# Patient Record
Sex: Male | Born: 1990 | Race: Black or African American | Hispanic: No | Marital: Single | Smoking: Never smoker
Health system: Southern US, Community
[De-identification: ages and names within clinical notes are randomized; demographics above are authoritative.]

## PROBLEM LIST (undated history)

## (undated) DIAGNOSIS — J40 Bronchitis, not specified as acute or chronic: Secondary | ICD-10-CM

## (undated) DIAGNOSIS — J45909 Unspecified asthma, uncomplicated: Secondary | ICD-10-CM

---
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
## 2012-02-17 ENCOUNTER — Encounter (HOSPITAL_COMMUNITY): Payer: Self-pay | Admitting: *Deleted

## 2012-02-17 ENCOUNTER — Emergency Department (HOSPITAL_COMMUNITY)
Admission: EM | Admit: 2012-02-17 | Discharge: 2012-02-17 | Disposition: A | Discharge status: Home / Self Care | Payer: Medicaid Other | Attending: Emergency Medicine | Admitting: Emergency Medicine

## 2012-02-17 DIAGNOSIS — J40 Bronchitis, not specified as acute or chronic: Secondary | ICD-10-CM | POA: Insufficient documentation

## 2012-02-17 HISTORY — DX: Unspecified asthma, uncomplicated: J45.909

## 2012-02-17 MED ORDER — PREDNISONE 20 MG PO TABS: 60.0000 mg | ORAL_TABLET | Freq: Every day | ORAL | Status: AC

## 2012-02-17 MED ORDER — ALBUTEROL SULFATE (5 MG/ML) 0.5% IN NEBU
5.0000 mg | INHALATION_SOLUTION | Freq: Once | RESPIRATORY_TRACT | Status: AC
Start: 2012-02-17 — End: 2012-02-17
  Administered 2012-02-17: 5 mg via RESPIRATORY_TRACT
  Filled 2012-02-17: qty 0.5

## 2012-02-17 MED ORDER — ALBUTEROL SULFATE HFA 108 (90 BASE) MCG/ACT IN AERS
2.0000 | INHALATION_SPRAY | RESPIRATORY_TRACT | Status: DC | PRN
  Administered 2012-02-17: 2 via RESPIRATORY_TRACT
  Filled 2012-02-17: qty 6.7

## 2012-02-17 MED ORDER — PREDNISONE 20 MG PO TABS
60.0000 mg | ORAL_TABLET | Freq: Once | ORAL | Status: AC
  Administered 2012-02-17: 60 mg via ORAL
  Filled 2012-02-17: qty 3

## 2012-02-17 MED ORDER — IPRATROPIUM BROMIDE 0.02 % IN SOLN
0.5000 mg | Freq: Once | RESPIRATORY_TRACT | Status: AC
  Administered 2012-02-17: 0.5 mg via RESPIRATORY_TRACT
  Filled 2012-02-17: qty 5

## 2012-02-17 MED ORDER — ALBUTEROL SULFATE (5 MG/ML) 0.5% IN NEBU
5.0000 mg | INHALATION_SOLUTION | Freq: Once | RESPIRATORY_TRACT | Status: AC
  Administered 2012-02-17: 5 mg via RESPIRATORY_TRACT
  Filled 2012-02-17: qty 1

## 2012-02-17 NOTE — ED Notes (Signed)
hhn given 

## 2012-02-17 NOTE — ED Provider Notes (Signed)
History     CSN: [id number]  Arrival date & time 02/17/12  0116   First MD Initiated Contact with Patient 02/17/12 0209      Chief Complaint  Patient presents with  . Asthma   HPI  History provided by the patient. Patient is a 21 year old male with past history of asthma who presents with complaints of increasing wheezing and shortness of breath symptoms. Patient states symptoms began yesterday evening and all day today. Patient also felt worse tonight with difficulty breathing and hard time sleeping. Patient reports developing a productive cough. He denies any fever, chills or sweats. Denies any rhinorrhea or nasal congestion. Patient has no recent travel. Denies any known sick contacts. Patient reports having similar symptoms previously last year diagnosed with bronchitis. He does not have daily asthma symptoms are frequent asthma symptoms any longer. He had more asthma problems as a child. He has not used medications in several years.    Past Medical History  Diagnosis Date  . Asthma     History reviewed. No pertinent past surgical history.  No family history on file.  History  Substance Use Topics  . Smoking status: Never Smoker   . Smokeless tobacco: Not on file  . Alcohol Use: No      Review of Systems  Constitutional: Negative for fever and chills.  HENT: Negative for congestion and rhinorrhea.   Respiratory: Positive for cough, shortness of breath and wheezing.   Cardiovascular: Negative for chest pain.  Gastrointestinal: Negative for nausea, vomiting, abdominal pain and diarrhea.    Allergies  Review of patient's allergies indicates no known allergies.  Home Medications  No current outpatient prescriptions on file.  BP 139/86  Pulse 104  Temp 98.3 F (36.8 C) (Oral)  Resp 22  SpO2 97%  Physical Exam  Nursing note and vitals reviewed. Constitutional: He is oriented to person, place, and time. He appears well-developed and well-nourished. No distress.   HENT:  Head: Normocephalic.  Mouth/Throat: Oropharynx is clear and moist.  Neck: Normal range of motion. Neck supple.  Cardiovascular: Normal rate and regular rhythm.   Pulmonary/Chest: Effort normal. No respiratory distress. He has wheezes. He has no rales. He exhibits no tenderness.  Abdominal: Soft. There is no tenderness. There is no rebound.  Lymphadenopathy:    He has no cervical adenopathy.  Neurological: He is alert and oriented to person, place, and time.  Skin: Skin is warm.  Psychiatric: He has a normal mood and affect. His behavior is normal.    ED Course  Procedures     1. Bronchitis       MDM  2:15AM patient seen and evaluated. Patient currently in no acute distress. Patient still has some wheezing on exam. He has received breathing treatment and reports feeling improvement. He does feel an additional treatment will be beneficial.  Patient given second breathing treatment and has significant prominent wheezes. He has normal respirations and O2 sats at this time. Patient also given a dose of prednisone. We'll send patient home with albuterol MDI and small prescription for prednisone.        Angus Seller, [place] 02/18/12 [phone]

## 2012-02-17 NOTE — ED Notes (Signed)
The pt has had difficulty breathing all day worse tonight.  Audible wheezes

## 2012-02-18 NOTE — ED Provider Notes (Signed)
Medical screening examination/treatment/procedure(s) were performed by non-physician practitioner and as supervising physician I was immediately available for consultation/collaboration.    Vida Roller, MD 02/18/12 [phone]

## 2012-03-20 ENCOUNTER — Emergency Department (INDEPENDENT_AMBULATORY_CARE_PROVIDER_SITE_OTHER)
Admission: EM | Admit: 2012-03-20 | Discharge: 2012-03-20 | Disposition: A | Discharge status: Home / Self Care | Payer: Medicaid Other | Source: Home / Self Care | Attending: Emergency Medicine | Admitting: Emergency Medicine

## 2012-03-20 ENCOUNTER — Encounter (HOSPITAL_COMMUNITY): Payer: Self-pay | Admitting: Emergency Medicine

## 2012-03-20 DIAGNOSIS — J45909 Unspecified asthma, uncomplicated: Secondary | ICD-10-CM

## 2012-03-20 DIAGNOSIS — J45901 Unspecified asthma with (acute) exacerbation: Secondary | ICD-10-CM

## 2012-03-20 HISTORY — DX: Bronchitis, not specified as acute or chronic: J40

## 2012-03-20 MED ORDER — ALBUTEROL SULFATE (5 MG/ML) 0.5% IN NEBU
5.0000 mg | INHALATION_SOLUTION | Freq: Once | RESPIRATORY_TRACT | Status: AC
  Administered 2012-03-20: 5 mg via RESPIRATORY_TRACT

## 2012-03-20 MED ORDER — ALBUTEROL SULFATE (5 MG/ML) 0.5% IN NEBU
INHALATION_SOLUTION | RESPIRATORY_TRACT | Status: AC
  Filled 2012-03-20: qty 0.5

## 2012-03-20 MED ORDER — METHYLPREDNISOLONE SODIUM SUCC 125 MG IJ SOLR
INTRAMUSCULAR | Status: AC
  Filled 2012-03-20: qty 2

## 2012-03-20 MED ORDER — ALBUTEROL SULFATE HFA 108 (90 BASE) MCG/ACT IN AERS: 1.0000 | INHALATION_SPRAY | Freq: Four times a day (QID) | RESPIRATORY_TRACT | Status: DC | PRN

## 2012-03-20 MED ORDER — METHYLPREDNISOLONE SODIUM SUCC 125 MG IJ SOLR
125.0000 mg | Freq: Once | INTRAMUSCULAR | Status: AC
  Administered 2012-03-20: 125 mg via INTRAMUSCULAR

## 2012-03-20 MED ORDER — IPRATROPIUM BROMIDE 0.02 % IN SOLN
0.5000 mg | Freq: Once | RESPIRATORY_TRACT | Status: AC
  Administered 2012-03-20: 0.5 mg via RESPIRATORY_TRACT

## 2012-03-20 MED ORDER — PREDNISONE 10 MG PO TABS: ORAL_TABLET | ORAL | Status: DC

## 2012-03-20 MED ORDER — BECLOMETHASONE DIPROPIONATE 80 MCG/ACT IN AERS: 2.0000 | INHALATION_SPRAY | Freq: Two times a day (BID) | RESPIRATORY_TRACT | Status: DC

## 2012-03-20 NOTE — ED Provider Notes (Signed)
Chief Complaint  Patient presents with  . Bronchitis    History of Present Illness:   Colton Fowler is a 21 year old male who presents with a one-day history of dry cough wheezing, chest tightness, and shortness of breath. His chest feels a bit achy. He has a history of "bronchitis" in the past but has never been diagnosed as having asthma. He was seen at the hospital about a month ago with similar symptoms, however at that time he had a productive cough and was given some antibiotics as well as an inhaler. He denies any fever, chills, sweats, nasal congestion, rhinorrhea, sore throat, or GI complaints. He has no history of allergies or eczema.  Review of Systems:  Other than noted above, the patient denies any of the following symptoms. Systemic:  No fever, chills, sweats, fatigue, myalgias, headache, weight loss or anorexia. Eye:  No redness, itching, or drainage. ENT:  No earache, ear congestion, nasal congestion, sneezing, rhinorrhea, sinus pressure, sinus pain, post nasal drip, or sore throat. Lungs:  No cough, sputum production, or shortness of breath. No chest pain. GI:  No indigestion, heartburn, abdominal pain, nausea, or vomiting. Skin:  No rash or itching.  PMFSH:  Past medical history, family history, social history, meds, and allergies were reviewed.  No history of allergic rhinitis.  No use of tobacco.  Physical Exam:   Vital signs:  BP 127/84  Pulse 90  Temp 98.6 F (37 C) (Oral)  Resp 18  SpO2 98% General:  Alert, in no distress. Eye:  No conjunctival injection or drainage. Lids were normal. ENT:  TMs and canals were normal, without erythema or inflammation.  Nasal mucosa was clear and uncongested, without drainage.  Mucous membranes were moist.  Pharynx was clear, without exudate or drainage.  There were no oral ulcerations or lesions. Neck:  Supple, no adenopathy, tenderness or mass. Lungs:  No retractions or use of accessory muscles.  No respiratory distress.  He had  bilateral expiratory wheezes without rales or rhonchi, but with good air movement.   Heart:  Regular rhythm, without gallops, murmers or rubs. Skin:  Clear, warm, and dry, without rash or lesions.  Course in Urgent Care Center:   He was given a DuoNeb nebulizer treatment and Solu-Medrol 125 mg IM. He tolerated these well without any side effects. After the [facility] treatment he felt a lot better, denied any wheezing, and on auscultation his lungs were wheezing free.  Assessment:  The encounter diagnosis was Asthma attack.  Plan:   1.  The following meds were prescribed:   New Prescriptions   ALBUTEROL (PROVENTIL HFA;VENTOLIN HFA) 108 (90 BASE) MCG/ACT INHALER    Inhale 1-2 puffs into the lungs every 6 (six) hours as needed for wheezing.   BECLOMETHASONE (QVAR) 80 MCG/ACT INHALER    Inhale 2 puffs into the lungs 2 (two) times daily.   PREDNISONE (DELTASONE) 10 MG TABLET    Take 4 tabs daily for 4 days, 3 tabs daily for 4 days, 2 tabs daily for 4 days, then 1 tab daily for 4 days.   2.  The patient was instructed in symptomatic care and handouts were given. 3.  The patient was told to return if becoming worse in any way, if no better in 3 or 4 days, and given some red flag symptoms that would indicate earlier return.  Follow up:  The patient was told to follow up with primary care physician within a week.     Reuben Likes, MD 03/20/12  1331 

## 2012-03-20 NOTE — ED Notes (Signed)
Reports sob with walking and moving around.  Usual activities leave patient feeling tightness in chest.  Patient has audible wheezing.

## 2012-03-20 NOTE — ED Notes (Signed)
Patient reports bronchitis, shortness of breath noticed this morning.  Patient denies runny nose, cough prior to sob.

## 2012-08-26 ENCOUNTER — Emergency Department (HOSPITAL_COMMUNITY)
Admission: EM | Admit: 2012-08-26 | Discharge: 2012-08-27 | Disposition: A | Discharge status: Home / Self Care | Payer: Medicaid Other | Attending: Emergency Medicine | Admitting: Emergency Medicine

## 2012-08-26 ENCOUNTER — Encounter (HOSPITAL_COMMUNITY): Payer: Self-pay

## 2012-08-26 DIAGNOSIS — Z79899 Other long term (current) drug therapy: Secondary | ICD-10-CM | POA: Insufficient documentation

## 2012-08-26 DIAGNOSIS — R0789 Other chest pain: Secondary | ICD-10-CM | POA: Insufficient documentation

## 2012-08-26 DIAGNOSIS — J45909 Unspecified asthma, uncomplicated: Secondary | ICD-10-CM

## 2012-08-26 DIAGNOSIS — J45901 Unspecified asthma with (acute) exacerbation: Secondary | ICD-10-CM | POA: Insufficient documentation

## 2012-08-26 MED ORDER — ALBUTEROL SULFATE (5 MG/ML) 0.5% IN NEBU
5.0000 mg | INHALATION_SOLUTION | Freq: Once | RESPIRATORY_TRACT | Status: AC
  Administered 2012-08-26: 5 mg via RESPIRATORY_TRACT
  Filled 2012-08-26: qty 1

## 2012-08-26 NOTE — ED Notes (Signed)
Patient presents with chest tightness, wheezing and shortness of breath for the past 4 hours (around 7 pm). Denies any recent illness or sick contacts. No fevers, sweats or chills.

## 2012-08-26 NOTE — ED Notes (Signed)
MD at bedside. 

## 2012-08-27 ENCOUNTER — Emergency Department (HOSPITAL_COMMUNITY): Payer: Medicaid Other

## 2012-08-27 MED ORDER — ALBUTEROL SULFATE HFA 108 (90 BASE) MCG/ACT IN AERS: 1.0000 | INHALATION_SPRAY | Freq: Four times a day (QID) | RESPIRATORY_TRACT | Status: DC | PRN

## 2012-08-27 MED ORDER — PREDNISONE 10 MG PO TABS: 50.0000 mg | ORAL_TABLET | Freq: Every day | ORAL | Status: DC

## 2012-08-27 MED ORDER — BECLOMETHASONE DIPROPIONATE 80 MCG/ACT IN AERS: 1.0000 | INHALATION_SPRAY | RESPIRATORY_TRACT | Status: DC | PRN

## 2012-08-27 MED ORDER — PREDNISONE 20 MG PO TABS
60.0000 mg | ORAL_TABLET | Freq: Once | ORAL | Status: AC
  Administered 2012-08-27: 60 mg via ORAL
  Filled 2012-08-27: qty 3

## 2012-08-27 NOTE — ED Provider Notes (Signed)
History     CSN: [id number]  Arrival date & time 08/26/12  2334   First MD Initiated Contact with Patient 08/26/12 2353      Chief Complaint  Patient presents with  . Shortness of Breath    (Consider location/radiation/quality/duration/timing/severity/associated sxs/prior treatment) Patient is a 22 y.o. male presenting with shortness of breath. The history is provided by the patient.  Shortness of Breath Severity:  Moderate Onset quality:  Gradual Duration:  4 hours Timing:  Constant Progression:  Worsening Chronicity:  Recurrent Relieved by:  Nothing Worsened by:  Nothing tried Ineffective treatments:  None tried Associated symptoms: no chest pain   Risk factors: no hx of PE/DVT, no prolonged immobilization, no recent surgery and no tobacco use     Past Medical History  Diagnosis Date  . Asthma   . Bronchitis     History reviewed. No pertinent past surgical history.  No family history on file.  History  Substance Use Topics  . Smoking status: Never Smoker   . Smokeless tobacco: Never Used  . Alcohol Use: No      Review of Systems  Respiratory: Positive for shortness of breath.   Cardiovascular: Negative for chest pain.  All other systems reviewed and are negative.    Allergies  Review of patient's allergies indicates no known allergies.  Home Medications   Current Outpatient Rx  Name  Route  Sig  Dispense  Refill  . albuterol (PROVENTIL HFA;VENTOLIN HFA) 108 (90 BASE) MCG/ACT inhaler   Inhalation   Inhale 1-2 puffs into the lungs every 6 (six) hours as needed for wheezing.   1 Inhaler   0   . beclomethasone (QVAR) 80 MCG/ACT inhaler   Inhalation   Inhale 2 puffs into the lungs 2 (two) times daily.   1 Inhaler   0   . predniSONE (DELTASONE) 10 MG tablet      Take 4 tabs daily for 4 days, 3 tabs daily for 4 days, 2 tabs daily for 4 days, then 1 tab daily for 4 days.   40 tablet   0     BP 144/82  Pulse 73  Temp(Src) 97.6 F (36.4 C)  (Oral)  Resp 18  SpO2 99%  Physical Exam  Constitutional: He is oriented to person, place, and time. He appears well-developed and well-nourished.  HENT:  Head: Normocephalic and atraumatic.  Eyes: Conjunctivae are normal. Pupils are equal, round, and reactive to light.  Neck: Normal range of motion. Neck supple.  Cardiovascular: Normal rate, regular rhythm, normal heart sounds and intact distal pulses.   Pulmonary/Chest: Effort normal. He has wheezes.  Abdominal: Soft. Bowel sounds are normal.  Neurological: He is alert and oriented to person, place, and time.  Skin: Skin is warm and dry.  Psychiatric: He has a normal mood and affect. His behavior is normal. Judgment and thought content normal.    ED Course  Procedures (including critical care time)  Labs Reviewed - No data to display Dg Chest [facility] 1 View  08/27/2012  *RADIOLOGY REPORT*  Clinical Data: Shortness of breath.  PORTABLE CHEST - 1 VIEW  Comparison: 08/02/2005.  Findings: The cardiac silhouette, mediastinal and hilar contours are normal.  The lungs are clear.  No pleural effusion.  The bony thorax is intact.  IMPRESSION: Normal chest x-ray.   Original Report Authenticated By: Rudie Meyer, M.D.      No diagnosis found.    MDM  Mild wheezing,  Out of rx asthma medication.  Will  ibr, steriod,  reassess        Rosanne Ashing, MD 08/27/12 0020

## 2012-08-27 NOTE — ED Notes (Signed)
Patient resting on stretcher at this time. States he was having shortness of breath, tightness and wheezing in chest. After neb tx states he is feeling much better and denies any complaints at this time. No acute distress noted, resp are even and unlabored at this time. Call light on bed. Will continue to monitor.

## 2012-08-27 NOTE — ED Notes (Signed)
Patient given discharge instructions for asthma. rx for albuterol, qvar, and prendisone. Advised to follow up with primary care or to return to this department if condition worsens. Patient voiced understanding of all instructions and had no further questions. Patient ambulated to front lobby without difficulty.

## 2014-06-14 ENCOUNTER — Emergency Department (INDEPENDENT_AMBULATORY_CARE_PROVIDER_SITE_OTHER)
Admission: EM | Admit: 2014-06-14 | Discharge: 2014-06-14 | Disposition: A | Discharge status: Home / Self Care | Payer: Self-pay | Source: Home / Self Care | Attending: Family Medicine | Admitting: Family Medicine

## 2014-06-14 ENCOUNTER — Encounter (HOSPITAL_COMMUNITY): Payer: Self-pay | Admitting: Emergency Medicine

## 2014-06-14 DIAGNOSIS — L42 Pityriasis rosea: Secondary | ICD-10-CM

## 2014-06-14 NOTE — ED Provider Notes (Signed)
Colton Fowler is a 24 y.o. male who presents to Urgent Care today for rash. Patient has developed a rash on his torso over the past 5 days. Started out as one largest patch on his right abdomen. It does not hurt or itch or bother him. No fevers or chills. He feels well otherwise. No new medications of detergents shampoos etc.   Past Medical History  Diagnosis Date  . Asthma   . Bronchitis    History reviewed. No pertinent past surgical history. History  Substance Use Topics  . Smoking status: Never Smoker   . Smokeless tobacco: Never Used  . Alcohol Use: Yes     Comment: once a month   ROS as above Medications: No current facility-administered medications for this encounter.   Current Outpatient Prescriptions  Medication Sig Dispense Refill  . albuterol (PROVENTIL HFA;VENTOLIN HFA) 108 (90 BASE) MCG/ACT inhaler Inhale 1-2 puffs into the lungs every 6 (six) hours as needed for wheezing. 1 Inhaler 0  . albuterol (PROVENTIL HFA;VENTOLIN HFA) 108 (90 BASE) MCG/ACT inhaler Inhale 1-2 puffs into the lungs every 6 (six) hours as needed for wheezing. 1 Inhaler 0   No Known Allergies   Exam:  BP 121/73 mmHg  Pulse 57  Temp(Src) 98.7 F (37.1 C) (Oral)  Resp 16  SpO2 97% Gen: Well NAD Skin: Mildly erythematous blanching scaly macular rash abdomen. Rash extends to extremities. Harold patch right abdomen  No results found for this or any previous visit (from the past 24 hour(s)). No results found.  Assessment and Plan: 24 y.o. male with pityriasis rosea. Watchful waiting.  Discussed warning signs or symptoms. Please see discharge instructions. Patient expresses understanding.     [name], MD 06/14/14 [phone]

## 2014-06-14 NOTE — ED Notes (Signed)
Pt noted red bumps on his chest last Wednesday.  Since then it has spread to his back and arms.  Pt denies any itching.

## 2014-06-14 NOTE — Discharge Instructions (Signed)
Thank you for coming in today. ° ° °Pityriasis Rosea °Pityriasis rosea is a rash which is probably caused by a virus. It generally starts as a scaly, red patch on the trunk (the area of the body that a t-shirt would cover) but does not appear on sun exposed areas. The rash is usually preceded by an initial larger spot called the "herald patch" a week or more before the rest of the rash appears. Generally within one to two days the rash appears rapidly on the trunk, upper arms, and sometimes the upper legs. The rash usually appears as flat, oval patches of scaly pink color. The rash can also be raised and one is able to feel it with a finger. The rash can also be finely crinkled and may slough off leaving a ring of scale around the spot. Sometimes a mild sore throat is present with the rash. It usually affects children and young adults in the spring and autumn. Women are more frequently affected than men. °TREATMENT  °Pityriasis rosea is a self-limited condition. This means it goes away within 4 to 8 weeks without treatment. The spots may persist for several months, especially in darker-colored skin after the rash has resolved and healed. Benadryl and steroid creams may be used if itching is a problem. °SEEK MEDICAL CARE IF:  °· Your rash does not go away or persists longer than three months. °· You develop fever and joint pain. °· You develop severe headache and confusion. °· You develop breathing difficulty, vomiting and/or extreme weakness. °Document Released: 06/27/2001 Document Revised: 08/13/2011 Document Reviewed: 07/16/2008 °ExitCare® Patient Information ©2015 [employer]. This information is not intended to replace advice given to you by your health care provider. Make sure you discuss any questions you have with your health care provider. ° °

## 2014-09-12 ENCOUNTER — Emergency Department (INDEPENDENT_AMBULATORY_CARE_PROVIDER_SITE_OTHER)
Admission: EM | Admit: 2014-09-12 | Discharge: 2014-09-12 | Disposition: A | Discharge status: Home / Self Care | Payer: Self-pay | Source: Home / Self Care | Attending: Family Medicine | Admitting: Family Medicine

## 2014-09-12 ENCOUNTER — Encounter (HOSPITAL_COMMUNITY): Payer: Self-pay | Admitting: Emergency Medicine

## 2014-09-12 DIAGNOSIS — J9801 Acute bronchospasm: Secondary | ICD-10-CM

## 2014-09-12 DIAGNOSIS — J40 Bronchitis, not specified as acute or chronic: Secondary | ICD-10-CM

## 2014-09-12 MED ORDER — ALBUTEROL SULFATE HFA 108 (90 BASE) MCG/ACT IN AERS
1.0000 | INHALATION_SPRAY | Freq: Four times a day (QID) | RESPIRATORY_TRACT | Status: DC | PRN
Start: 2014-09-12 — End: 2017-09-05

## 2014-09-12 MED ORDER — ALBUTEROL SULFATE (2.5 MG/3ML) 0.083% IN NEBU
INHALATION_SOLUTION | RESPIRATORY_TRACT | Status: AC
  Filled 2014-09-12: qty 6

## 2014-09-12 MED ORDER — IPRATROPIUM BROMIDE 0.02 % IN SOLN
RESPIRATORY_TRACT | Status: AC
  Filled 2014-09-12: qty 2.5

## 2014-09-12 MED ORDER — PREDNISONE 20 MG PO TABS
60.0000 mg | ORAL_TABLET | Freq: Once | ORAL | Status: AC
  Administered 2014-09-12: 60 mg via ORAL

## 2014-09-12 MED ORDER — PREDNISONE 20 MG PO TABS
ORAL_TABLET | ORAL | Status: AC
  Filled 2014-09-12: qty 3

## 2014-09-12 MED ORDER — ALBUTEROL SULFATE (2.5 MG/3ML) 0.083% IN NEBU
5.0000 mg | INHALATION_SOLUTION | Freq: Once | RESPIRATORY_TRACT | Status: AC
  Administered 2014-09-12: 5 mg via RESPIRATORY_TRACT

## 2014-09-12 MED ORDER — IPRATROPIUM BROMIDE 0.02 % IN SOLN
0.5000 mg | Freq: Once | RESPIRATORY_TRACT | Status: AC
  Administered 2014-09-12: 0.5 mg via RESPIRATORY_TRACT

## 2014-09-12 MED ORDER — LORATADINE 10 MG PO TABS: 10.0000 mg | ORAL_TABLET | Freq: Every day | ORAL | Status: DC

## 2014-09-12 NOTE — ED Provider Notes (Signed)
CSN: [id number]     Arrival date & time 09/12/14  1307 History   First MD Initiated Contact with Patient 09/12/14 1403     Chief Complaint  Patient presents with  . 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 male presenting with URI. The history is provided by the patient.  URI Presenting symptoms: congestion, cough and rhinorrhea   Presenting symptoms: no fever and no sore throat   Severity:  Moderate Onset quality:  Gradual Duration:  2 days Timing:  Constant Progression:  Worsening Chronicity:  New Associated symptoms: wheezing     Past Medical History  Diagnosis Date  . Asthma   . Bronchitis    History reviewed. No pertinent past surgical history. No family history on file. History  Substance Use Topics  . Smoking status: Never Smoker   . Smokeless tobacco: Never Used  . Alcohol Use: Yes     Comment: once a month    Review of Systems  Constitutional: Negative for fever and chills.  HENT: Positive for congestion and rhinorrhea. Negative for postnasal drip, sore throat and trouble swallowing.   Eyes: Negative.   Respiratory: Positive for cough, chest tightness, shortness of breath and wheezing.   Cardiovascular: Negative.   Gastrointestinal: Negative.   Musculoskeletal: Negative for back pain.  Skin: Negative.     Allergies  Review of patient's allergies indicates no known allergies.  Home Medications   Prior to Admission medications   Medication Sig Start Date End Date Taking? Authorizing Provider  albuterol (PROVENTIL HFA;VENTOLIN HFA) 108 (90 BASE) MCG/ACT inhaler Inhale 1-2 puffs into the lungs every 6 (six) hours as needed for wheezing or shortness of breath. 09/12/14   Mathis FareJennifer Lee H Emberleigh Reily, PA  loratadine (CLARITIN) 10 MG tablet Take 1 tablet (10 mg  total) by mouth daily. 09/12/14   Jess BartersJennifer Lee H Enjoli Tidd, PA   BP 127/85 mmHg  Pulse 64  Temp(Src) 98.1 F (36.7 C) (Oral)  Resp 16  SpO2 98% Physical Exam  Constitutional: He is oriented to person, place, and time. He appears well-developed and well-nourished.  HENT:  Head: Normocephalic and atraumatic.  Right Ear: Hearing, tympanic membrane, external ear and ear canal normal.  Left Ear: Hearing, tympanic membrane, external ear and ear canal normal.  Nose: Nose normal.  Mouth/Throat: Uvula is midline, oropharynx is clear and moist and mucous membranes are normal.  Eyes: Conjunctivae are normal. No scleral icterus.  Neck: Normal range of motion. Neck supple.  Cardiovascular: Normal rate, regular rhythm and normal heart sounds.   Pulmonary/Chest: Effort normal. No stridor. No respiratory distress. He has wheezes. He has no rales. He exhibits no tenderness.  Musculoskeletal: Normal range of motion.  Lymphadenopathy:    He has no cervical adenopathy.  Neurological: He is alert and oriented to person, place, and time.  Skin: Skin is warm and dry.  Psychiatric: He has a normal mood and affect. His behavior is normal.  Nursing note and vitals reviewed.   ED Course  Procedures (including critical care time) Labs Review Labs Reviewed - No data to display  Imaging Review No results found.   MDM   1. Bronchospasm   2. Bronchitis    Given 5mg  albuterol and 0.5mg  atrovent neb [facility]  No respiratory distress, fever or hypoxia. Suspect bronchospasm to have been triggered by either  viral URI or environmental allergy.  Will continue treatment at home with Albuterol MDI & Claritin and advise PCP follow up if no improvement. Instructed to report to his nearest ER if symptoms become suddenly worse or severe despite above medications    Ria Clock, [place] 09/12/14 [phone]

## 2014-09-12 NOTE — ED Notes (Signed)
Patient c/o chest congestion with shortness of breath x 3 days. Patient reports he has a hx of asthma and bronchitis. States he took Mucinex with mild relief. Patient is in NAD.

## 2014-09-12 NOTE — Discharge Instructions (Signed)
Bronchospasm °A bronchospasm is a spasm or tightening of the airways going into the lungs. During a bronchospasm breathing becomes more difficult because the airways get smaller. When this happens there can be coughing, a whistling sound when breathing (wheezing), and difficulty breathing. Bronchospasm is often associated with asthma, but not all patients who experience a bronchospasm have asthma. °CAUSES  °A bronchospasm is caused by inflammation or irritation of the airways. The inflammation or irritation may be triggered by:  °· Allergies (such as to animals, pollen, food, or mold). Allergens that cause bronchospasm may cause wheezing immediately after exposure or many hours later.   °· Infection. Viral infections are believed to be the most common cause of bronchospasm.   °· Exercise.   °· Irritants (such as pollution, cigarette smoke, strong odors, aerosol sprays, and paint fumes).   °· Weather changes. Winds increase molds and pollens in the air. Rain refreshes the air by washing irritants out. Cold air may cause inflammation.   °· Stress and emotional upset.   °SIGNS AND SYMPTOMS  °· Wheezing.   °· Excessive nighttime coughing.   °· Frequent or severe coughing with a simple cold.   °· Chest tightness.   °· Shortness of breath.   °DIAGNOSIS  °Bronchospasm is usually diagnosed through a history and physical exam. Tests, such as chest X-rays, are sometimes done to look for other conditions. °TREATMENT  °· Inhaled medicines can be given to open up your airways and help you breathe. The medicines can be given using either an inhaler or a nebulizer machine. °· Corticosteroid medicines may be given for severe bronchospasm, usually when it is associated with asthma. °HOME CARE INSTRUCTIONS  °· Always have a plan prepared for seeking medical care. Know when to call your health care provider and local emergency services (911 in the [place] Know where you can access local emergency care. °· Only take medicines as  directed by your health care provider. °· If you were prescribed an inhaler or nebulizer machine, ask your health care provider to explain how to use it correctly. Always use a spacer with your inhaler if you were given one. °· It is necessary to remain calm during an attack. Try to relax and breathe more slowly.  °· Control your home environment in the following ways:   °¨ Change your heating and air conditioning filter at least once a month.   °¨ Limit your use of fireplaces and wood stoves. °¨ Do not smoke and do not allow smoking in your home.   °¨ Avoid exposure to perfumes and fragrances.   °¨ Get rid of pests (such as roaches and mice) and their droppings.   °¨ Throw away plants if you see mold on them.   °¨ Keep your house clean and dust free.   °¨ Replace carpet with wood, tile, or vinyl flooring. Carpet can trap dander and dust.   °¨ Use allergy-proof pillows, mattress covers, and box spring covers.   °¨ Wash bed sheets and blankets every week in hot water and dry them in a dryer.   °¨ Use blankets that are made of polyester or cotton.   °¨ Wash hands frequently. °SEEK MEDICAL CARE IF:  °· You have muscle aches.   °· You have chest pain.   °· The sputum changes from clear or white to yellow, green, gray, or bloody.   °· The sputum you cough up gets thicker.   °· There are problems that may be related to the medicine you are given, such as a rash, itching, swelling, or trouble breathing.   °SEEK IMMEDIATE MEDICAL CARE IF:  °· You have worsening wheezing and coughing even   after taking your prescribed medicines.   °· You have increased difficulty breathing.   °· You develop severe chest pain. °MAKE SURE YOU:  °· Understand these instructions. °· Will watch your condition. °· Will get help right away if you are not doing well or get worse. °Document Released: 05/24/2003 Document Revised: 05/26/2013 Document Reviewed: 11/10/2012 °ExitCare® Patient Information ©2015 [employer]. This information is not  intended to replace advice given to you by your health care provider. Make sure you discuss any questions you have with your health care provider. ° °

## 2016-12-27 ENCOUNTER — Ambulatory Visit: Payer: Self-pay | Admitting: Registered Nurse

## 2016-12-27 VITALS — BP 140/84 | HR 52 | Temp 97.7°F

## 2016-12-27 DIAGNOSIS — K5289 Other specified noninfective gastroenteritis and colitis: Secondary | ICD-10-CM

## 2016-12-27 DIAGNOSIS — J301 Allergic rhinitis due to pollen: Secondary | ICD-10-CM

## 2016-12-27 DIAGNOSIS — H6593 Unspecified nonsuppurative otitis media, bilateral: Secondary | ICD-10-CM

## 2016-12-27 DIAGNOSIS — R1031 Right lower quadrant pain: Secondary | ICD-10-CM

## 2016-12-27 DIAGNOSIS — G8929 Other chronic pain: Secondary | ICD-10-CM

## 2016-12-27 MED ORDER — BISMUTH SUBSALICYLATE 262 MG PO CHEW
524.0000 mg | CHEWABLE_TABLET | ORAL | 0 refills | Status: AC | PRN
Start: 2016-12-27 — End: 2016-12-30

## 2016-12-27 MED ORDER — ACETAMINOPHEN 500 MG PO TABS: 500.0000 mg | ORAL_TABLET | Freq: Four times a day (QID) | ORAL | 0 refills | Status: DC | PRN

## 2016-12-27 NOTE — Progress Notes (Signed)
Subjective:    Patient ID: Colton Fowler, male    DOB: [birth year], 26 y.o.   MRN: [id number]  26y/o african [place] male new patient here for evaluation RLQ pain after taking ibuprofen this morning x30 mins. Sudden onset. Pain 7/10. +nausea, denies v/d. Afebrile currently. Took 800mg  Ibuprofen at 10am today for L mid back pain which has resolved. Appendix present, denies surgical Hx.   PMHx asthma, seasonal allergies; ran out of claritin not taking any allergy medication right now and not having any problems.  Ate breakfast and snack at break without difficulty.  Has taken motrin in the past without problems for headache or muscle aches.  Usually once a month.  Had usual formed bowel movement this morning.  Denied heartburn, diarrhea, blood in stool red or black.      Review of Systems  Constitutional: Negative for activity change, appetite change, chills, diaphoresis, fatigue and fever.  HENT: Negative for congestion, ear pain, facial swelling, hearing loss, mouth sores, nosebleeds, postnasal drip, rhinorrhea, sinus pain, sinus pressure, sneezing, sore throat, tinnitus, trouble swallowing and voice change.   Eyes: Negative for photophobia, pain, discharge and visual disturbance.  Respiratory: Negative for cough, chest tightness, shortness of breath, wheezing and stridor.   Cardiovascular: Negative for chest pain, palpitations and leg swelling.  Gastrointestinal: Positive for abdominal pain. Negative for abdominal distention, anal bleeding, blood in stool, constipation, diarrhea, nausea, rectal pain and vomiting.  Endocrine: Negative for cold intolerance and heat intolerance.  Genitourinary: Negative for difficulty urinating, dysuria and hematuria.  Musculoskeletal: Positive for back pain. Negative for arthralgias, gait problem, joint swelling, myalgias, neck pain and neck stiffness.  Skin: Negative for color change, pallor, rash and wound.  Allergic/Immunologic: Negative for environmental  allergies and food allergies.  Neurological: Negative for dizziness, tremors, seizures, syncope, facial asymmetry, speech difficulty, weakness, light-headedness, numbness and headaches.  Hematological: Negative for adenopathy. Does not bruise/bleed easily.  Psychiatric/Behavioral: Negative for agitation, confusion and sleep disturbance. The patient is not nervous/anxious.        Objective:   Physical Exam  Constitutional: He is oriented to person, place, and time. Vital signs are normal. He appears well-developed and well-nourished. He is active and cooperative.  Non-toxic appearance. He does not have a sickly appearance. He does not appear ill. No distress.  HENT:  Head: Normocephalic and atraumatic.  Right Ear: Hearing, external ear and ear canal normal. A middle ear effusion is present.  Left Ear: Hearing, external ear and ear canal normal. A middle ear effusion is present.  Nose: Mucosal edema and rhinorrhea present. No nose lacerations, sinus tenderness, nasal deformity, septal deviation or nasal septal hematoma. No epistaxis.  No foreign bodies. Right sinus exhibits no maxillary sinus tenderness and no frontal sinus tenderness. Left sinus exhibits no maxillary sinus tenderness and no frontal sinus tenderness.  Mouth/Throat: Uvula is midline and mucous membranes are normal. Mucous membranes are not pale, not dry and not cyanotic. He does not have dentures. No oral lesions. No trismus in the jaw. Normal dentition. No dental abscesses, uvula swelling, lacerations or dental caries. Posterior oropharyngeal edema and posterior oropharyngeal erythema present. No oropharyngeal exudate or tonsillar abscesses.  Cobblestoning posterior pharynx; bilateral TMs air fluid level clear; bilateral allergic shiners; bilateral nasal turbinates edema/erythema clear discharge  Eyes: Pupils are equal, round, and reactive to light. Conjunctivae, EOM and lids are normal. Right eye exhibits no chemosis, no discharge,  no exudate and no hordeolum. No foreign body present in the right eye. Left eye exhibits  no chemosis, no discharge, no exudate and no hordeolum. No foreign body present in the left eye. Right conjunctiva is not injected. Right conjunctiva has no hemorrhage. Left conjunctiva is not injected. Left conjunctiva has no hemorrhage. No scleral icterus. Right eye exhibits normal extraocular motion and no nystagmus. Left eye exhibits normal extraocular motion and no nystagmus. Right pupil is round and reactive. Left pupil is round and reactive. Pupils are equal.  Neck: Trachea normal, normal range of motion and phonation normal. Neck supple. No tracheal tenderness, no spinous process tenderness and no muscular tenderness present. No neck rigidity. No tracheal deviation, no edema, no erythema and normal range of motion present. No thyroid mass and no thyromegaly present.  Cardiovascular: Normal rate, regular rhythm, S1 normal, S2 normal, normal heart sounds and intact distal pulses.  PMI is not displaced.  Exam reveals no gallop and no friction rub.   No murmur heard. Pulmonary/Chest: Effort normal and breath sounds normal. No stridor. No respiratory distress. He has no decreased breath sounds. He has no wheezes. He has no rhonchi. He has no rales.  Abdominal: Soft. Normal appearance and bowel sounds are normal. He exhibits no shifting dullness, no distension, no pulsatile liver, no fluid wave, no abdominal bruit, no ascites, no pulsatile midline mass and no mass. There is hepatosplenomegaly. There is tenderness in the right lower quadrant. There is no rigidity, no rebound, no guarding, no tenderness at McBurney's point and negative Murphy's sign. Hernia confirmed negative in the ventral area.    Dull to percussion x 4 quads; normoactive bowel sounds; negative heel drop; on/off exam table; in/out of chair without difficulty worsening pain with back extension; no change with flexion/lateral flexion/rotation lumbar   Musculoskeletal: Normal range of motion. He exhibits no edema or tenderness.       Right shoulder: Normal.       Left shoulder: Normal.       Right elbow: Normal.      Left elbow: Normal.       Right hip: Normal.       Left hip: Normal.       Right knee: Normal.       Left knee: Normal.       Cervical back: Normal.       Thoracic back: Normal.       Lumbar back: Normal.       Right hand: Normal.       Left hand: Normal.  Lymphadenopathy:       Head (right side): No submental, no submandibular, no tonsillar, no preauricular, no posterior auricular and no occipital adenopathy present.       Head (left side): No submental, no submandibular, no tonsillar, no preauricular, no posterior auricular and no occipital adenopathy present.    He has no cervical adenopathy.       Right cervical: No superficial cervical, no deep cervical and no posterior cervical adenopathy present.      Left cervical: No superficial cervical, no deep cervical and no posterior cervical adenopathy present.  Neurological: He is alert and oriented to person, place, and time. He has normal strength. He displays no atrophy and no tremor. No cranial nerve deficit or sensory deficit. He exhibits normal muscle tone. He displays no seizure activity. Coordination and gait normal. GCS eye subscore is 4. GCS verbal subscore is 5. GCS motor subscore is 6.  On/off exam table; in/out of chair without difficulty; gait sure and steady in hall strength 5/5 bilaterally  Skin: Skin is  warm, dry and intact. No abrasion, no bruising, no burn, no ecchymosis, no laceration, no lesion, no petechiae and no rash noted. He is not diaphoretic. No cyanosis or erythema. No pallor. Nails show no clubbing.  Psychiatric: He has a normal mood and affect. His speech is normal and behavior is normal. Judgment and thought content normal. Cognition and memory are normal.  Nursing note and vitals reviewed.         Assessment & Plan:   A-gastroenteritis  P-peptobismol 1-2 tabs po prn diarrhea given 2 UD from clinic stock; stop ibuprofen may take tylenol 1000mg  po QID prn pain.  Bland diet ER precautions given  I have recommended clear fluids and bland diet.  Avoid dairy/spicy, fried and large portions of meat while having nausea.  If vomiting hold po intake x 1 hour.  Then sips clear fluids like broths, ginger ale, power ade, gatorade, pedialyte may advance to soft/bland if no vomiting x 24 hours and appetite returned otherwise hydration main focus.     Return to the clinic if symptoms persist or worsen; I have alerted the patient to call if high fever, dehydration, marked weakness, fainting, increased abdominal pain, repetitive vomiting, blood in stool or vomit (red or black) or go to ER if clinic closed for re-evaluation   Exitcare handout on gastroenteritis and abdomen pain given to patient. Patient verbalized agreement and understanding of treatment plan and had no further questions at this time.

## 2016-12-27 NOTE — Patient Instructions (Signed)
Allergic Rhinitis Allergic rhinitis is when the mucous membranes in the nose respond to allergens. Allergens are particles in the air that cause your body to have an allergic reaction. This causes you to release allergic antibodies. Through a chain of events, these eventually cause you to release histamine into the blood stream. Although meant to protect the body, it is this release of histamine that causes your discomfort, such as frequent sneezing, congestion, and an itchy, runny nose. What are the causes? Seasonal allergic rhinitis (hay fever) is caused by pollen allergens that may come from grasses, trees, and weeds. Year-round allergic rhinitis (perennial allergic rhinitis) is caused by allergens such as house dust mites, pet dander, and mold spores. What are the signs or symptoms?  Nasal stuffiness (congestion).  Itchy, runny nose with sneezing and tearing of the eyes. How is this diagnosed? Your health care provider can help you determine the allergen or allergens that trigger your symptoms. If you and your health care provider are unable to determine the allergen, skin or blood testing may be used. Your health care provider will diagnose your condition after taking your health history and performing a physical exam. Your health care provider may assess you for other related conditions, such as asthma, pink eye, or an ear infection. How is this treated? Allergic rhinitis does not have a cure, but it can be controlled by:  Medicines that block allergy symptoms. These may include allergy shots, nasal sprays, and oral antihistamines.  Avoiding the allergen.  Hay fever may often be treated with antihistamines in pill or nasal spray forms. Antihistamines block the effects of histamine. There are over-the-counter medicines that may help with nasal congestion and swelling around the eyes. Check with your health care provider before taking or giving this medicine. If avoiding the allergen or the  medicine prescribed do not work, there are many new medicines your health care provider can prescribe. Stronger medicine may be used if initial measures are ineffective. Desensitizing injections can be used if medicine and avoidance does not work. Desensitization is when a patient is given ongoing shots until the body becomes less sensitive to the allergen. Make sure you follow up with your health care provider if problems continue. Follow these instructions at home: It is not possible to completely avoid allergens, but you can reduce your symptoms by taking steps to limit your exposure to them. It helps to know exactly what you are allergic to so that you can avoid your specific triggers. Contact a health care provider if:  You have a fever.  You develop a cough that does not stop easily (persistent).  You have shortness of breath.  You start wheezing.  Symptoms interfere with normal daily activities. This information is not intended to replace advice given to you by your health care provider. Make sure you discuss any questions you have with your health care provider. Document Released: 02/13/2001 Document Revised: 01/20/2016 Document Reviewed: 01/26/2013 Elsevier Interactive Patient Education  2017 [employer] Abdominal Pain, Adult Abdominal pain can be caused by many things. Often, abdominal pain is not serious and it gets better with no treatment or by being treated at home. However, sometimes abdominal pain is serious. Your health care provider will do a medical history and a physical exam to try to determine the cause of your abdominal pain. Follow these instructions at home:  Take over-the-counter and prescription medicines only as told by your health care provider. Do not take a laxative unless told by your health  care provider.  Drink enough fluid to keep your urine clear or pale yellow.  Watch your condition for any changes.  Keep all follow-up visits as told by your health  care provider. This is important. Contact a health care provider if:  Your abdominal pain changes or gets worse.  You are not hungry or you lose weight without trying.  You are constipated or have diarrhea for more than 2-3 days.  You have pain when you urinate or have a bowel movement.  Your abdominal pain wakes you up at night.  Your pain gets worse with meals, after eating, or with certain foods.  You are throwing up and cannot keep anything down.  You have a fever. Get help right away if:  Your pain does not go away as soon as your health care provider told you to expect.  You cannot stop throwing up.  Your pain is only in areas of the abdomen, such as the right side or the left lower portion of the abdomen.  You have bloody or black stools, or stools that look like tar.  You have severe pain, cramping, or bloating in your abdomen.  You have signs of dehydration, such as: ? Dark urine, very little urine, or no urine. ? Cracked lips. ? Dry mouth. ? Sunken eyes. ? Sleepiness. ? Weakness. This information is not intended to replace advice given to you by your health care provider. Make sure you discuss any questions you have with your health care provider. Document Released: 02/28/2005 Document Revised: 12/09/2015 Document Reviewed: 11/02/2015 Elsevier Interactive Patient Education  2017 [employer]. Norovirus Infection A norovirus infection is caused by exposure to a virus in a group of similar viruses (noroviruses). This type of infection causes inflammation in your stomach and intestines (gastroenteritis). Norovirus is the most common cause of gastroenteritis. It also causes food poisoning. Anyone can get a norovirus infection. It spreads very easily (contagious). You can get it from contaminated food, water, surfaces, or other people. Norovirus is found in the stool or vomit of infected people. You can spread the infection as soon as you feel sick until 2 weeks after  you recover. Symptoms usually begin within 2 days after you become infected. Most norovirus symptoms affect the digestive system. What are the causes? Norovirus infection is caused by contact with norovirus. You can catch norovirus if you:  Eat or drink something contaminated with norovirus.  Touch surfaces or objects contaminated with norovirus and then put your hand in your mouth.  Have direct contact with an infected person who has symptoms.  Share food, drink, or utensils with someone with who is sick with norovirus.  What are the signs or symptoms? Symptoms of norovirus may include:  Nausea.  Vomiting.  Diarrhea.  Stomach cramps.  Fever.  Chills.  Headache.  Muscle aches.  Tiredness.  How is this diagnosed? Your health care provider may suspect norovirus based on your symptoms and physical exam. Your health care provider may also test a sample of your stool or vomit for the virus. How is this treated? There is no specific treatment for norovirus. Most people get better without treatment in about 2 days. Follow these instructions at home:  Replace lost fluids by drinking plenty of water or rehydration fluids containing important minerals called electrolytes. This prevents dehydration. Drink enough fluid to keep your urine clear or pale yellow.  Do not prepare food for others while you are infected. Wait at least 3 days after recovering from the  illness to do that. How is this prevented?  Wash your hands often, especially after using the toilet or changing a diaper.  Wash fruits and vegetables thoroughly before preparing or serving them.  Throw out any food that a sick person may have touched.  Disinfect contaminated surfaces immediately after someone in the household has been sick. Use a bleach-based household cleaner.  Immediately remove and wash soiled clothes or sheets. Contact a health care provider if:  Your vomiting, diarrhea, and stomach pain is  getting worse.  Your symptoms of norovirus do not go away after 2-3 days. Get help right away if: You develop symptoms of dehydration that do not improve with fluid replacement. This may include:  Excessive sleepiness.  Lack of tears.  Dry mouth.  Dizziness when standing.  Weak pulse.  This information is not intended to replace advice given to you by your health care provider. Make sure you discuss any questions you have with your health care provider. Document Released: 08/11/2002 Document Revised: 10/27/2015 Document Reviewed: 10/29/2013 Elsevier Interactive Patient Education  2017 [employer]

## 2017-03-15 ENCOUNTER — Ambulatory Visit: Payer: Self-pay | Admitting: *Deleted

## 2017-03-15 VITALS — HR 78 | Resp 18

## 2017-03-18 NOTE — Progress Notes (Signed)
03/15/17: [facility] [employer] was called at 3pm. RN responded. Found pt sitting upright in chair. No obvious distress. Coworkers report he said he felt short of breath and had to work hard to breathe. O2 sat 98%, pulse wnl, RR 18, unlabored. Speaking in complete sentences. No obvious audible wheezing. Mildly diminished breath sounds LLL. Mild wheezing RLL. Remainder of breath sounds CTA.   Pt had reported to RN office earlier in the day asking if we kept inhalers on hand as he had forgotten his at home today. He reported he was asymptomatic at that time, but had a Hx of asthma and bronchitis and was worried it would get worse during the day working in the heat [place]. As we do not carry inhalers, encouraged pt to go home on lunch break to get his-he lives in [place]- or have Rx from his pharmacy transferred to the external pharmacy 5 min from work to have one refill completed there that he can pick up at work-he reports he does not drive and cannot get there. Advised pt if he starts to feel short of breath or lightheaded to sit down, cool off, deep breaths. Stated he would be fine and proceeded back to work.   Advised pt after evaluation during [facility] to proceed home to get his inhaler-having someone drive him home-and if sx are unchanged after 2 puffs q1h x2 hours, then to proceed to UC for eval as he does not have nebulizer at home. He agrees with plan of care and verbalizes understanding. No further questions/concerns

## 2017-03-18 NOTE — Progress Notes (Signed)
03/18/17, 10:50: Attempted to contact pt's supervisor to follow up with pt. No answer. Awaiting call back. No appts noted in Epic as being seen over the weekend.

## 2017-06-19 ENCOUNTER — Encounter (HOSPITAL_COMMUNITY): Payer: Self-pay | Admitting: Emergency Medicine

## 2017-06-19 ENCOUNTER — Ambulatory Visit (HOSPITAL_COMMUNITY)
Admission: EM | Admit: 2017-06-19 | Discharge: 2017-06-19 | Disposition: A | Discharge status: Home / Self Care | Payer: No Typology Code available for payment source | Attending: Family Medicine | Admitting: Family Medicine

## 2017-06-19 DIAGNOSIS — R0602 Shortness of breath: Secondary | ICD-10-CM

## 2017-06-19 DIAGNOSIS — R0989 Other specified symptoms and signs involving the circulatory and respiratory systems: Secondary | ICD-10-CM

## 2017-06-19 DIAGNOSIS — R062 Wheezing: Secondary | ICD-10-CM

## 2017-06-19 DIAGNOSIS — J45901 Unspecified asthma with (acute) exacerbation: Secondary | ICD-10-CM | POA: Diagnosis not present

## 2017-06-19 DIAGNOSIS — R0981 Nasal congestion: Secondary | ICD-10-CM | POA: Diagnosis not present

## 2017-06-19 MED ORDER — PREDNISONE 20 MG PO TABS: 40.0000 mg | ORAL_TABLET | Freq: Every day | ORAL | 0 refills | Status: AC

## 2017-06-19 MED ORDER — IPRATROPIUM-ALBUTEROL 0.5-2.5 (3) MG/3ML IN SOLN
3.0000 mL | Freq: Once | RESPIRATORY_TRACT | Status: AC
  Administered 2017-06-19: 3 mL via RESPIRATORY_TRACT

## 2017-06-19 MED ORDER — IPRATROPIUM-ALBUTEROL 0.5-2.5 (3) MG/3ML IN SOLN
RESPIRATORY_TRACT | Status: AC
  Filled 2017-06-19: qty 3

## 2017-06-19 MED ORDER — AZITHROMYCIN 250 MG PO TABS: ORAL_TABLET | ORAL | 0 refills | Status: DC

## 2017-06-19 MED ORDER — SODIUM CHLORIDE 0.9 % IN NEBU
INHALATION_SOLUTION | RESPIRATORY_TRACT | Status: AC
  Filled 2017-06-19: qty 3

## 2017-06-19 NOTE — Discharge Instructions (Signed)
Please continue albuterol inhaler as needed for shortness of breath/chest tightness.   Take prednisone daily x 5 days  Azithromycin daily for the next 5 days.   Tylenol/Ibuprofen for body aches  Please return if symptoms not improving with treatment or worsening, develop increased shortness of breath.

## 2017-06-19 NOTE — ED Provider Notes (Signed)
[facility]    CSN: [id number] Arrival date & time: 06/19/17  1245     History   Chief Complaint Chief Complaint  Patient presents with  . URI    HPI Colton Fowler is a 27 y.o. male history of asthma presenting with cough and wheezing over 1 week. States he has also had congestion. Denies sore throat. Symptoms worsening in past couple of days. Using albuterol inhaler and cold/flu symptom relief. Has shortness of breath especially when lying flat. Chest is sore and hurts with coughing, myalgias. No fevers, nausea or vomiting. Smoked for approximately 1 year.  HPI  Past Medical History:  Diagnosis Date  . Asthma   . Bronchitis     There are no active problems to display for this patient.   History reviewed. No pertinent surgical history.     Home Medications    Prior to Admission medications   Medication Sig Start Date End Date Taking? Authorizing Provider  albuterol (PROVENTIL HFA;VENTOLIN HFA) 108 (90 BASE) MCG/ACT inhaler Inhale 1-2 puffs into the lungs every 6 (six) hours as needed for wheezing or shortness of breath. 09/12/14  Yes Presson, Jess Barters H, PA  acetaminophen (TYLENOL) 500 MG tablet Take 1 tablet (500 mg total) by mouth every 6 (six) hours as needed for moderate pain. 12/27/16   Betancourt, Jarold Song, NP  azithromycin (ZITHROMAX Z-PAK) 250 MG tablet Please take first 2 tablets today, then 1 daily for the following 4 days 06/19/17   Wieters, Hallie C, PA-C  predniSONE (DELTASONE) 20 MG tablet Take 2 tablets (40 mg total) by mouth daily for 5 days. 06/19/17 06/24/17  Wieters, Junius Creamer, PA-C    Family History History reviewed. No pertinent family history.  Social History Social History   Tobacco Use  . Smoking status: Never Smoker  . Smokeless tobacco: Never Used  Substance Use Topics  . Alcohol use: Yes    Comment: once a month  . Drug use: Yes    Frequency: 0.5 times per week    Types: Marijuana     Allergies   Shellfish  allergy   Review of Systems Review of Systems  Constitutional: Negative for activity change, appetite change, fatigue and fever.  HENT: Positive for congestion, postnasal drip and rhinorrhea. Negative for ear pain, sinus pressure and sore throat.   Eyes: Negative for pain and itching.  Respiratory: Positive for cough and shortness of breath.   Cardiovascular: Negative for chest pain.  Gastrointestinal: Negative for abdominal pain, diarrhea, nausea and vomiting.  Musculoskeletal: Positive for myalgias.  Skin: Negative for rash.  Neurological: Negative for dizziness, light-headedness and headaches.     Physical Exam Triage Vital Signs ED Triage Vitals  Enc Vitals Group     BP 06/19/17 1325 122/68     Pulse Rate 06/19/17 1325 69     Resp 06/19/17 1325 20     Temp 06/19/17 1325 98.9 F (37.2 C)     Temp Source 06/19/17 1325 Oral     SpO2 06/19/17 1325 98 %     Weight --      Height --      Head Circumference --      Peak Flow --      Pain Score 06/19/17 1324 5     Pain Loc --      Pain Edu? --      Excl. in GC? --    No data found.  Updated Vital Signs BP 122/68 (BP Location: Left Arm)  Pulse 69   Temp 98.9 F (37.2 C) (Oral)   Resp 20   SpO2 98%    Physical Exam  Constitutional: He appears well-developed and well-nourished. No distress.  HENT:  Head: Normocephalic and atraumatic.  Right Ear: Tympanic membrane and ear canal normal.  Left Ear: Tympanic membrane and ear canal normal.  Nose: Nose normal. Right sinus exhibits no maxillary sinus tenderness and no frontal sinus tenderness. Left sinus exhibits no maxillary sinus tenderness and no frontal sinus tenderness.  Mouth/Throat: Uvula is midline and mucous membranes are normal. No oral lesions. No trismus in the jaw. No uvula swelling. Posterior oropharyngeal erythema present. No tonsillar exudate.  Eyes: Conjunctivae are normal.  Neck: Neck supple.  Cardiovascular: Normal rate and regular rhythm.  No murmur  heard. Pulmonary/Chest: Effort normal. No respiratory distress. He has wheezes.  Diffuse inspiratory and expiratory high-pitched wheezing bilaterally; difficult for him to take deep breaths; mildly improved after treatment, but still present  Abdominal: Soft. There is no tenderness.  Musculoskeletal: He exhibits no edema.  Neurological: He is alert.  Skin: Skin is warm and dry.  Psychiatric: He has a normal mood and affect.  Nursing note and vitals reviewed.    UC Treatments / Results  Labs (all labs ordered are listed, but only abnormal results are displayed) Labs Reviewed - No data to display  EKG  EKG Interpretation None       Radiology No results found.  Procedures Procedures (including critical care time)  Medications Ordered in UC Medications  ipratropium-albuterol (DUONEB) 0.5-2.5 (3) MG/3ML nebulizer solution 3 mL (3 mLs Nebulization Given 06/19/17 1400)     Initial Impression / Assessment and Plan / UC Course  I have reviewed the triage vital signs and the nursing notes.  Pertinent labs & imaging results that were available during my care of the patient were reviewed by me and considered in my medical decision making (see chart for details).     Patient is likely having an exacerbation of asthma with increased chest congestion, possibly related to URI. VSS. Sent home with azithromycin given length of symptoms and prednisone x 5 days. Advised may try mucinex DM for congestion/cough. Discussed strict return precautions. Patient verbalized understanding and is agreeable with plan.   Final Clinical Impressions(s) / UC Diagnoses   Final diagnoses:  Chest congestion  Asthma with acute exacerbation, unspecified asthma severity, unspecified whether persistent    ED Discharge Orders        Ordered    azithromycin (ZITHROMAX Z-PAK) 250 MG tablet     06/19/17 1414    predniSONE (DELTASONE) 20 MG tablet  Daily     06/19/17 1414       Controlled Substance  Prescriptions [place] Controlled Substance Registry consulted? Not Applicable   Lew DawesWieters, Hallie C, [place]PA-C 06/19/17 1437

## 2017-06-19 NOTE — ED Triage Notes (Signed)
PT C/O: cold sx onset 1 week associated w/prod cough, nasal congestion, wheezing, CP, fatigue, fevers  Hx of asthma  TAKING MEDS: rescue inhalers  A&O x4... NAD... Ambulatory

## 2017-06-20 ENCOUNTER — Other Ambulatory Visit: Payer: Self-pay

## 2017-06-20 ENCOUNTER — Emergency Department (HOSPITAL_COMMUNITY): Payer: No Typology Code available for payment source

## 2017-06-20 ENCOUNTER — Encounter (HOSPITAL_COMMUNITY): Payer: Self-pay | Admitting: Emergency Medicine

## 2017-06-20 DIAGNOSIS — Z79899 Other long term (current) drug therapy: Secondary | ICD-10-CM | POA: Insufficient documentation

## 2017-06-20 DIAGNOSIS — J069 Acute upper respiratory infection, unspecified: Secondary | ICD-10-CM | POA: Diagnosis not present

## 2017-06-20 DIAGNOSIS — R0602 Shortness of breath: Secondary | ICD-10-CM | POA: Diagnosis present

## 2017-06-20 DIAGNOSIS — J4521 Mild intermittent asthma with (acute) exacerbation: Secondary | ICD-10-CM | POA: Insufficient documentation

## 2017-06-20 MED ORDER — ALBUTEROL SULFATE (2.5 MG/3ML) 0.083% IN NEBU
5.0000 mg | INHALATION_SOLUTION | Freq: Once | RESPIRATORY_TRACT | Status: AC
  Administered 2017-06-20: 5 mg via RESPIRATORY_TRACT
  Filled 2017-06-20: qty 6

## 2017-06-20 NOTE — ED Triage Notes (Signed)
Pt reports cold like S/S X1 week, cough, nasal congestion, SOB. [facility] and given tx which did help but pt started wheezing again today.

## 2017-06-21 ENCOUNTER — Emergency Department (HOSPITAL_COMMUNITY)
Admission: EM | Admit: 2017-06-21 | Discharge: 2017-06-21 | Disposition: A | Discharge status: Home / Self Care | Payer: No Typology Code available for payment source | Attending: Emergency Medicine | Admitting: Emergency Medicine

## 2017-06-21 DIAGNOSIS — J069 Acute upper respiratory infection, unspecified: Secondary | ICD-10-CM

## 2017-06-21 DIAGNOSIS — J4521 Mild intermittent asthma with (acute) exacerbation: Secondary | ICD-10-CM

## 2017-06-21 LAB — BASIC METABOLIC PANEL
ANION GAP: 12 (ref 5–15)
BUN: 15 mg/dL (ref 6–20)
CALCIUM: 9.2 mg/dL (ref 8.9–10.3)
CO2: 22 mmol/L (ref 22–32)
Chloride: 103 mmol/L (ref 101–111)
Creatinine, Ser: 1.17 mg/dL (ref 0.61–1.24)
GFR calc Af Amer: >60 mL/min (ref >60)
GFR calc non Af Amer: >60 mL/min (ref >60)
GLUCOSE: 128 mg/dL — AB (ref 65–99)
Potassium: 3.8 mmol/L (ref 3.5–5.1)
Sodium: 137 mmol/L (ref 135–145)

## 2017-06-21 LAB — CBC WITH DIFFERENTIAL/PLATELET
Basophils Absolute: 0 10*3/uL (ref 0.0–0.1)
Basophils Relative: 0 %
Eosinophils Absolute: 0.2 10*3/uL (ref 0.0–0.7)
Eosinophils Relative: 3 %
HEMATOCRIT: 43.1 % (ref 39.0–52.0)
Hemoglobin: 15.3 g/dL (ref 13.0–17.0)
LYMPHS ABS: 1.6 10*3/uL (ref 0.7–4.0)
Lymphocytes Relative: 18 %
MCH: 29.2 pg (ref 26.0–34.0)
MCHC: 35.5 g/dL (ref 30.0–36.0)
MCV: 82.3 fL (ref 78.0–100.0)
MONO ABS: 0.6 10*3/uL (ref 0.1–1.0)
MONOS PCT: 7 %
NEUTROS ABS: 6.3 10*3/uL (ref 1.7–7.7)
Neutrophils Relative %: 72 %
Platelets: 209 10*3/uL (ref 150–400)
RBC: 5.24 MIL/uL (ref 4.22–5.81)
RDW: 14.3 % (ref 11.5–15.5)
WBC: 8.8 10*3/uL (ref 4.0–10.5)

## 2017-06-21 MED ORDER — IPRATROPIUM BROMIDE 0.02 % IN SOLN
1.0000 mg | Freq: Once | RESPIRATORY_TRACT | Status: AC
  Administered 2017-06-21: 1 mg via RESPIRATORY_TRACT
  Filled 2017-06-21: qty 5

## 2017-06-21 MED ORDER — IPRATROPIUM BROMIDE 0.02 % IN SOLN
0.5000 mg | Freq: Once | RESPIRATORY_TRACT | Status: AC
  Administered 2017-06-21: 0.5 mg via RESPIRATORY_TRACT
  Filled 2017-06-21: qty 2.5

## 2017-06-21 MED ORDER — ALBUTEROL SULFATE (2.5 MG/3ML) 0.083% IN NEBU
5.0000 mg | INHALATION_SOLUTION | Freq: Once | RESPIRATORY_TRACT | Status: AC
  Administered 2017-06-21: 5 mg via RESPIRATORY_TRACT
  Filled 2017-06-21: qty 6

## 2017-06-21 MED ORDER — METHYLPREDNISOLONE SODIUM SUCC 125 MG IJ SOLR
125.0000 mg | Freq: Once | INTRAMUSCULAR | Status: AC
  Administered 2017-06-21: 125 mg via INTRAVENOUS
  Filled 2017-06-21: qty 2

## 2017-06-21 MED ORDER — ALBUTEROL (5 MG/ML) CONTINUOUS INHALATION SOLN
15.0000 mg/h | INHALATION_SOLUTION | Freq: Once | RESPIRATORY_TRACT | Status: AC
  Administered 2017-06-21: 15 mg/h via RESPIRATORY_TRACT
  Filled 2017-06-21: qty 20

## 2017-06-21 MED ORDER — ALBUTEROL SULFATE HFA 108 (90 BASE) MCG/ACT IN AERS
2.0000 | INHALATION_SPRAY | Freq: Once | RESPIRATORY_TRACT | Status: AC
  Administered 2017-06-21: 2 via RESPIRATORY_TRACT
  Filled 2017-06-21: qty 6.7

## 2017-06-21 NOTE — ED Notes (Signed)
RT notified about continues neb treatment need.

## 2017-06-21 NOTE — ED Provider Notes (Signed)
TIME SEEN: 3:58 AM  CHIEF COMPLAINT: Shortness of breath, wheezing  HPI: Patient is a 27 year old male with history of asthma who presents to the emergency department with complaints of shortness of breath and wheezing.  He has had nasal congestion and cough with white sputum production.  No known fever.  States he was seen in urgent care and given a breathing treatment which improved his symptoms.  Was also given a breathing treatment here in the waiting room.  He states he is already on prednisone and azithromycin.  He states he has been using his albuterol inhaler but only 1-2 puffs at a time.  ROS: See HPI Constitutional: no fever  Eyes: no drainage  ENT: no runny nose   Cardiovascular:  no chest pain  Resp: Cough, wheezing, SOB  GI: no vomiting GU: no dysuria Integumentary: no rash  Allergy: no hives  Musculoskeletal: no leg swelling  Neurological: no slurred speech ROS otherwise negative  PAST MEDICAL HISTORY/PAST SURGICAL HISTORY:  Past Medical History:  Diagnosis Date  . Asthma   . Bronchitis     MEDICATIONS:  Prior to Admission medications   Medication Sig Start Date End Date Taking? Authorizing Provider  acetaminophen (TYLENOL) 500 MG tablet Take 1 tablet (500 mg total) by mouth every 6 (six) hours as needed for moderate pain. 12/27/16   Betancourt, Jarold Songina A, NP  albuterol (PROVENTIL HFA;VENTOLIN HFA) 108 (90 BASE) MCG/ACT inhaler Inhale 1-2 puffs into the lungs every 6 (six) hours as needed for wheezing or shortness of breath. 09/12/14   Presson, Mathis FareJennifer Lee H, PA  azithromycin (ZITHROMAX Z-PAK) 250 MG tablet Please take first 2 tablets today, then 1 daily for the following 4 days 06/19/17   Wieters, Hallie C, PA-C  predniSONE (DELTASONE) 20 MG tablet Take 2 tablets (40 mg total) by mouth daily for 5 days. 06/19/17 06/24/17  Wieters, Hallie C, PA-C    ALLERGIES:  Allergies  Allergen Reactions  . Shellfish Allergy Swelling    Throat swelling    SOCIAL HISTORY:  Social  History   Tobacco Use  . Smoking status: Never Smoker  . Smokeless tobacco: Never Used  Substance Use Topics  . Alcohol use: Yes    Comment: once a month    FAMILY HISTORY: No family history on file.  EXAM: BP 135/74   Pulse 87   Temp 98.3 F (36.8 C)   Resp 19   Ht 6\' 1"  (1.854 m)   Wt 90.7 kg (200 lb)   SpO2 98%   BMI 26.39 kg/m  CONSTITUTIONAL: Alert and oriented and responds appropriately to questions. Well-appearing; well-nourished HEAD: Normocephalic EYES: Conjunctivae clear, pupils appear equal, EOMI ENT: normal nose; moist mucous membranes NECK: Supple, no meningismus, no nuchal rigidity, no LAD  CARD: RRR; S1 and S2 appreciated; no murmurs, no clicks, no rubs, no gallops RESP: Normal chest excursion without splinting or tachypnea; breath sounds equal bilaterally but patient does have diffuse expiratory wheezes, no rhonchi, no rales, no hypoxia or respiratory distress, speaking full sentences ABD/GI: Normal bowel sounds; non-distended; soft, non-tender, no rebound, no guarding, no peritoneal signs, no hepatosplenomegaly BACK:  The back appears normal and is non-tender to palpation, there is no CVA tenderness EXT: Normal ROM in all joints; non-tender to palpation; no edema; normal capillary refill; no cyanosis, no calf tenderness or swelling    SKIN: Normal color for age and race; warm; no rash NEURO: Moves all extremities equally PSYCH: The patient's mood and manner are appropriate. Grooming and personal hygiene  are appropriate.  MEDICAL DECISION MAKING: Patient here with bronchospasm.  Has been treated with azithromycin and prednisone which he started on the 16th.  Chest x-ray here today is clear.  No sign of volume overload.  No sign of infiltrate.  Last took a dose of prednisone at 5 PM yesterday.  Will give continuous albuterol and Atrovent treatment here.  Will reassess after CAT.  ED PROGRESS: Patient still has wheezing on exam after continuous treatment.  Will  give another breathing treatment here, IV Solu-Medrol and obtain labs.  I have offered him admission but he would like to try another treatment first which I feel is reasonable.  He does not have a PCP.   7:30 AM  Pt's lungs now clear after third breathing treatment.  No hypoxia or respiratory distress.  No increased work of breathing.  States he is feeling "much better".  Will discharge him with an albuterol inhaler.  Have instructed him that he can use 2-4 puffs every 2-4 hours.  We will have him continue azithromycin and prednisone until complete.  Given outpatient PCP follow-up and work note.  Discussed at length return precautions.  Patient and partner at bedside comfortable with this plan.   At this time, I do not feel there is any life-threatening condition present. I have reviewed and discussed all results (EKG, imaging, lab, urine as appropriate) and exam findings with patient/family. I have reviewed nursing notes and appropriate previous records.  I feel the patient is safe to be discharged home without further emergent workup and can continue workup as an outpatient as needed. Discussed usual and customary return precautions. Patient/family verbalize understanding and are comfortable with this plan.  Outpatient follow-up has been provided if needed. All questions have been answered.  CRITICAL CARE Performed by: Rochele Raring   Total critical care time: 45 minutes  Critical care time was exclusive of separately billable procedures and treating other patients.  Critical care was necessary to treat or prevent imminent or life-threatening deterioration.  Critical care was time spent personally by me on the following activities: development of treatment plan with patient and/or surrogate as well as nursing, discussions with consultants, evaluation of patient's response to treatment, examination of patient, obtaining history from patient or surrogate, ordering and performing treatments and  interventions, ordering and review of laboratory studies, ordering and review of radiographic studies, pulse oximetry and re-evaluation of patient's condition.    Ward, Layla Maw, DO 06/21/17 0730

## 2017-06-21 NOTE — Discharge Instructions (Signed)
You may use your albuterol inhaler 2-4 puffs every 2-4 hours as needed for shortness of breath and wheezing.  Please continue your azithromycin and prednisone until complete.  To find a primary care or specialty doctor please call [phone] or [phone] to access "[place] Find a Doctor Service."  You may also go on the [facility] website at [email or web address]  There are also multiple [facility], [facility], [facility] and [facility] practices throughout the [place] that are frequently accepting new patients. You may find a clinic that is close to your home and contact them.  [facility] -  [address] [place] [phone]   [facility] -  [address] [place] [id number] [phone]   [facility] [phone]  [place] [phone]

## 2017-06-26 ENCOUNTER — Other Ambulatory Visit: Payer: Self-pay

## 2017-06-26 ENCOUNTER — Encounter (HOSPITAL_COMMUNITY): Payer: Self-pay | Admitting: Emergency Medicine

## 2017-06-26 ENCOUNTER — Emergency Department (HOSPITAL_COMMUNITY): Payer: No Typology Code available for payment source

## 2017-06-26 DIAGNOSIS — J4551 Severe persistent asthma with (acute) exacerbation: Secondary | ICD-10-CM | POA: Diagnosis not present

## 2017-06-26 DIAGNOSIS — Z79899 Other long term (current) drug therapy: Secondary | ICD-10-CM | POA: Insufficient documentation

## 2017-06-26 DIAGNOSIS — R0602 Shortness of breath: Secondary | ICD-10-CM | POA: Diagnosis present

## 2017-06-26 NOTE — ED Triage Notes (Signed)
Pt c/o shortness of breath that started today. Hx asthma, took home neb treatment PTA with no relief. Lung sounds clear, pt states he feels like he is unable to take a deep breath. SpO2 95% room air. Recently treated for URI with antibiotics and steroids, reports finishing meds.

## 2017-06-27 ENCOUNTER — Emergency Department (HOSPITAL_COMMUNITY)
Admission: EM | Admit: 2017-06-27 | Discharge: 2017-06-27 | Disposition: A | Discharge status: Home / Self Care | Payer: No Typology Code available for payment source | Attending: Emergency Medicine | Admitting: Emergency Medicine

## 2017-06-27 DIAGNOSIS — J4551 Severe persistent asthma with (acute) exacerbation: Secondary | ICD-10-CM

## 2017-06-27 LAB — BASIC METABOLIC PANEL
Anion gap: 12 (ref 5–15)
BUN: 13 mg/dL (ref 6–20)
CALCIUM: 9.4 mg/dL (ref 8.9–10.3)
CHLORIDE: 102 mmol/L (ref 101–111)
CO2: 21 mmol/L — ABNORMAL LOW (ref 22–32)
CREATININE: 1.15 mg/dL (ref 0.61–1.24)
GFR calc non Af Amer: >60 mL/min (ref >60)
Glucose, Bld: 93 mg/dL (ref 65–99)
Potassium: 3.9 mmol/L (ref 3.5–5.1)
SODIUM: 135 mmol/L (ref 135–145)

## 2017-06-27 LAB — CBC WITH DIFFERENTIAL/PLATELET
Basophils Absolute: 0 10*3/uL (ref 0.0–0.1)
Basophils Relative: 0 %
EOS ABS: 1.1 10*3/uL — AB (ref 0.0–0.7)
Eosinophils Relative: 9 %
HCT: 45.3 % (ref 39.0–52.0)
HEMOGLOBIN: 16.4 g/dL (ref 13.0–17.0)
Lymphocytes Relative: 20 %
Lymphs Abs: 2.3 10*3/uL (ref 0.7–4.0)
MCH: 29.7 pg (ref 26.0–34.0)
MCHC: 36.2 g/dL — ABNORMAL HIGH (ref 30.0–36.0)
MCV: 82.1 fL (ref 78.0–100.0)
Monocytes Absolute: 0.6 10*3/uL (ref 0.1–1.0)
Monocytes Relative: 5 %
NEUTROS PCT: 66 %
Neutro Abs: 7.6 10*3/uL (ref 1.7–7.7)
Platelets: 250 10*3/uL (ref 150–400)
RBC: 5.52 MIL/uL (ref 4.22–5.81)
RDW: 14.1 % (ref 11.5–15.5)
WBC: 11.6 10*3/uL — ABNORMAL HIGH (ref 4.0–10.5)

## 2017-06-27 MED ORDER — ALBUTEROL (5 MG/ML) CONTINUOUS INHALATION SOLN
15.0000 mg/h | INHALATION_SOLUTION | Freq: Once | RESPIRATORY_TRACT | Status: AC
  Administered 2017-06-27: 15 mg/h via RESPIRATORY_TRACT
  Filled 2017-06-27: qty 20

## 2017-06-27 MED ORDER — IPRATROPIUM BROMIDE 0.02 % IN SOLN
0.5000 mg | Freq: Once | RESPIRATORY_TRACT | Status: AC
  Administered 2017-06-27: 0.5 mg via RESPIRATORY_TRACT
  Filled 2017-06-27: qty 2.5

## 2017-06-27 MED ORDER — MAGNESIUM SULFATE 2 GM/50ML IV SOLN
2.0000 g | Freq: Once | INTRAVENOUS | Status: AC
  Administered 2017-06-27: 2 g via INTRAVENOUS
  Filled 2017-06-27: qty 50

## 2017-06-27 MED ORDER — PREDNISONE 10 MG PO TABS
60.0000 mg | ORAL_TABLET | Freq: Every day | ORAL | 0 refills | Status: DC
Start: 2017-06-27 — End: 2017-09-05

## 2017-06-27 MED ORDER — IPRATROPIUM-ALBUTEROL 0.5-2.5 (3) MG/3ML IN SOLN
3.0000 mL | Freq: Once | RESPIRATORY_TRACT | Status: AC
  Administered 2017-06-27: 3 mL via RESPIRATORY_TRACT
  Filled 2017-06-27: qty 3

## 2017-06-27 MED ORDER — SODIUM CHLORIDE 0.9 % IV BOLUS (SEPSIS)
1000.0000 mL | Freq: Once | INTRAVENOUS | Status: AC
  Administered 2017-06-27: 1000 mL via INTRAVENOUS

## 2017-06-27 MED ORDER — METHYLPREDNISOLONE SODIUM SUCC 125 MG IJ SOLR
125.0000 mg | Freq: Once | INTRAMUSCULAR | Status: AC
  Administered 2017-06-27: 125 mg via INTRAVENOUS
  Filled 2017-06-27: qty 2

## 2017-06-27 NOTE — ED Provider Notes (Signed)
[facility] Provider Note   CSN: [id number] Arrival date & time: 06/26/17  1959     History   Chief Complaint Chief Complaint  Patient presents with  . Shortness of Breath    HPI Colton Fowler is a 27 y.o. male.  HPI  SUBJECTIVE:  Colton Fowler is a 27 y.o. male seen urgently with exacerbation of asthma for 2 days. Wheezing is described as severe. Associated symptoms:cough described as productive of clear sputum. Current asthma medications: ALBUTEROL. Patient denies smoke cigarettes. Patient has history of well-controlled asthma.  This month however he has had 2 visits prior to the current visit for asthma exacerbations.  Patient states that he got better after his last visit to the ER, however started getting sick again.  Patient denies any recent viral illnesses.  Patient does not see a primary care doctor for his asthma.  Pt has no hx of PE, DVT and denies any exogenous hormone (testosterone / estrogen) use, long distance travels or surgery in the past 6 weeks, active cancer, recent immobilization.   Past Medical History:  Diagnosis Date  . Asthma   . Bronchitis     There are no active problems to display for this patient.   History reviewed. No pertinent surgical history.     Home Medications    Prior to Admission medications   Medication Sig Start Date End Date Taking? Authorizing Provider  acetaminophen (TYLENOL) 500 MG tablet Take 1 tablet (500 mg total) by mouth every 6 (six) hours as needed for moderate pain. 12/27/16  Yes Betancourt, Jarold Songina A, NP  albuterol (PROVENTIL HFA;VENTOLIN HFA) 108 (90 BASE) MCG/ACT inhaler Inhale 1-2 puffs into the lungs every 6 (six) hours as needed for wheezing or shortness of breath. 09/12/14  Yes Presson, Jess BartersJennifer Lee H, PA  azithromycin (ZITHROMAX Z-PAK) 250 MG tablet Please take first 2 tablets today, then 1 daily for the following 4 days Patient not taking: Reported on 06/27/2017 06/19/17    Wieters, Hallie C, PA-C  predniSONE (DELTASONE) 10 MG tablet Take 6 tablets (60 mg total) by mouth daily. 06/27/17   Derwood KaplanNanavati, Deuce Paternoster, MD    Family History No family history on file.  Social History Social History   Tobacco Use  . Smoking status: Never Smoker  . Smokeless tobacco: Never Used  Substance Use Topics  . Alcohol use: Yes    Comment: once a month  . Drug use: Yes    Frequency: 0.5 times per week    Types: Marijuana     Allergies   Shellfish allergy   Review of Systems Review of Systems  Constitutional: Positive for activity change.  Respiratory: Positive for cough, chest tightness and shortness of breath.   Cardiovascular: Positive for chest pain.  Allergic/Immunologic: Negative for immunocompromised state.  All other systems reviewed and are negative.    Physical Exam Updated Vital Signs BP 124/77   Pulse (!) 107   Temp 99.1 F (37.3 C) (Oral)   Resp 16   Ht 6\' 1"  (1.854 m)   Wt 90.7 kg (200 lb)   SpO2 94%   BMI 26.39 kg/m   Physical Exam  Constitutional: He is oriented to person, place, and time. He appears well-developed.  HENT:  Head: Atraumatic.  Neck: Neck supple.  Cardiovascular: Normal rate.  Pulmonary/Chest: Effort normal. No accessory muscle usage or stridor. Tachypnea noted. He has wheezes in the right upper field, the right middle field, the right lower field, the left upper field, the  left middle field and the left lower field. He has no rhonchi. He has no rales.  Neurological: He is alert and oriented to person, place, and time.  Skin: Skin is warm.  Nursing note and vitals reviewed.    ED Treatments / Results  Labs (all labs ordered are listed, but only abnormal results are displayed) Labs Reviewed  BASIC METABOLIC PANEL - Abnormal; Notable for the following components:      Result Value   CO2 21 (*)    All other components within normal limits  CBC WITH DIFFERENTIAL/PLATELET - Abnormal; Notable for the following  components:   WBC 11.6 (*)    MCHC 36.2 (*)    Eosinophils Absolute 1.1 (*)    All other components within normal limits    EKG  EKG Interpretation  Date/Time:  Wednesday June 26 2017 20:08:43 EST Ventricular Rate:  123 PR Interval:  140 QRS Duration: 82 QT Interval:  306 QTC Calculation: 438 R Axis:   93 Text Interpretation:  Sinus tachycardia Right atrial enlargement Rightward axis Pulmonary disease pattern Cannot rule out Inferior infarct , age undetermined Abnormal ECG No old tracing to compare Nonspecific ST and T wave abnormality Confirmed by Derwood Kaplan [phone]) on 06/27/2017 2:44:13 AM       Radiology Dg Chest 2 View  Result Date: 06/26/2017 CLINICAL DATA:  Shortness of breath EXAM: CHEST  2 VIEW COMPARISON:  06/20/2017 FINDINGS: The heart size and mediastinal contours are within normal limits. Both lungs are clear. The visualized skeletal structures are unremarkable. IMPRESSION: No active cardiopulmonary disease. Electronically Signed   By: Jasmine Pang M.D.   On: 06/26/2017 20:55    Procedures Procedures (including critical care time)  CRITICAL CARE Performed by: Amahia Madonia   Total critical care time: 48 minutes  Critical care time was exclusive of separately billable procedures and treating other patients.  Critical care was necessary to treat or prevent imminent or life-threatening deterioration.  Critical care was time spent personally by me on the following activities: development of treatment plan with patient and/or surrogate as well as nursing, discussions with consultants, evaluation of patient's response to treatment, examination of patient, obtaining history from patient or surrogate, ordering and performing treatments and interventions, ordering and review of laboratory studies, ordering and review of radiographic studies, pulse oximetry and re-evaluation of patient's condition.   Medications Ordered in ED Medications  sodium chloride 0.9 %  bolus 1,000 mL (0 mLs Intravenous Stopped 06/27/17 0450)  albuterol (PROVENTIL,VENTOLIN) solution continuous neb (15 mg/hr Nebulization Given 06/27/17 0337)  ipratropium (ATROVENT) nebulizer solution 0.5 mg (0.5 mg Nebulization Given 06/27/17 0337)  magnesium sulfate IVPB 2 g 50 mL (0 g Intravenous Stopped 06/27/17 0451)  methylPREDNISolone sodium succinate (SOLU-MEDROL) 125 mg/2 mL injection 125 mg (125 mg Intravenous Given 06/27/17 0342)  ipratropium-albuterol (DUONEB) 0.5-2.5 (3) MG/3ML nebulizer solution 3 mL (3 mLs Nebulization Given 06/27/17 0539)     Initial Impression / Assessment and Plan / ED Course  I have reviewed the triage vital signs and the nursing notes.  Pertinent labs & imaging results that were available during my care of the patient were reviewed by me and considered in my medical decision making (see chart for details).  Clinical Course as of Jun 27 653  Thu Jun 27, 2017  2130 Repeat exam reveals clearing of wheezing in all lung fields. Patient is not in any respiratory distress, however he is noted to be saturating around 94% on room air. We discussed the possibility that  patient might get worse after being discharged, and that event we would like him to come back to the ER immediately.  And we would probably consider admission to the hospital. Patient wanted another breathing treatment to decide if he was comfortable with that plan -and he just informed me right now that he is okay with the plan.  Patient has received IV Solu-Medrol and IV magnesium in the ER prior to the discharge.    [AN]    Clinical Course User Index [AN] Derwood Kaplan, MD    ASSESSMENT:  Asthma - acute exacerbation   Patient with history of asthma comes in with chief complaint of chest tightness and wheezing.  Patient also has shortness of breath.  Patient is noted to have diffuse respiratory wheezing, with tachypnea.  Patient will be started on hour-long nebulizer treatment.  Chest x-ray does  not show any infiltrate.  Patient does not have any fevers, and his symptoms started suddenly 2 days ago, therefore suspicion for underlying pneumonia is low.   Final Clinical Impressions(s) / ED Diagnoses   Final diagnoses:  Severe persistent asthma with acute exacerbation    ED Discharge Orders        Ordered    predniSONE (DELTASONE) 10 MG tablet  Daily     06/27/17 0646       Derwood Kaplan, MD 06/27/17 [phone]

## 2017-06-27 NOTE — ED Notes (Signed)
Peak flow 190

## 2017-06-27 NOTE — Discharge Instructions (Signed)
We saw you in the ER for your asthma related complains. We gave you some breathing treatments in the ER, and seems like your symptoms have improved. Please take albuterol as needed every 4 hours. Please take the medications prescribed. Please refrain from smoking or smoke exposure. Please see a primary care doctor in 1 week. Return to the ER if your symptoms worsen.  

## 2017-06-27 NOTE — ED Notes (Signed)
Respiratory called for peak flow.  

## 2017-09-04 ENCOUNTER — Emergency Department (HOSPITAL_COMMUNITY)
Admission: EM | Admit: 2017-09-04 | Discharge: 2017-09-05 | Disposition: A | Discharge status: Home / Self Care | Payer: No Typology Code available for payment source | Attending: Emergency Medicine | Admitting: Emergency Medicine

## 2017-09-04 ENCOUNTER — Emergency Department (HOSPITAL_COMMUNITY): Payer: No Typology Code available for payment source

## 2017-09-04 ENCOUNTER — Encounter (HOSPITAL_COMMUNITY): Payer: Self-pay | Admitting: Emergency Medicine

## 2017-09-04 DIAGNOSIS — J45901 Unspecified asthma with (acute) exacerbation: Secondary | ICD-10-CM | POA: Diagnosis not present

## 2017-09-04 DIAGNOSIS — M791 Myalgia, unspecified site: Secondary | ICD-10-CM | POA: Diagnosis present

## 2017-09-04 DIAGNOSIS — J069 Acute upper respiratory infection, unspecified: Secondary | ICD-10-CM | POA: Diagnosis not present

## 2017-09-04 DIAGNOSIS — R0602 Shortness of breath: Secondary | ICD-10-CM

## 2017-09-04 MED ORDER — ALBUTEROL SULFATE (2.5 MG/3ML) 0.083% IN NEBU
5.0000 mg | INHALATION_SOLUTION | Freq: Once | RESPIRATORY_TRACT | Status: AC
  Administered 2017-09-04: 5 mg via RESPIRATORY_TRACT
  Filled 2017-09-04: qty 6

## 2017-09-04 MED ORDER — PREDNISONE 20 MG PO TABS
60.0000 mg | ORAL_TABLET | Freq: Once | ORAL | Status: AC
  Administered 2017-09-04: 60 mg via ORAL
  Filled 2017-09-04: qty 3

## 2017-09-04 MED ORDER — IPRATROPIUM-ALBUTEROL 0.5-2.5 (3) MG/3ML IN SOLN
3.0000 mL | Freq: Once | RESPIRATORY_TRACT | Status: AC
  Administered 2017-09-04: 3 mL via RESPIRATORY_TRACT
  Filled 2017-09-04: qty 3

## 2017-09-04 MED ORDER — IBUPROFEN 800 MG PO TABS
800.0000 mg | ORAL_TABLET | Freq: Once | ORAL | Status: AC
  Administered 2017-09-04: 800 mg via ORAL
  Filled 2017-09-04: qty 1

## 2017-09-04 NOTE — ED Provider Notes (Signed)
[facility]   CSN: [id number] Arrival date & time: 09/04/17  2029     History   Chief Complaint Chief Complaint  Patient presents with  . Influenza  . Asthma    HPI Carlton AdamDouglas Pustejovsky is a 27 y.o. male.  The history is provided by the patient and medical records. No language interpreter was used.  Influenza  Presenting symptoms: cough, fever and shortness of breath   Presenting symptoms: no diarrhea, no nausea, no sore throat and no vomiting   Associated symptoms: chills and nasal congestion   Asthma  Associated symptoms include shortness of breath. Pertinent negatives include no abdominal pain.   Carlton AdamDouglas Grissinger is a 27 y.o. male  with a PMH of asthma who presents to the Emergency Department complaining of acute onset of generalized body aches, intermittently productive cough which began two days ago.  Associated with subjective fever and chills.  Feels as if he gets very warm, then will get cold sweats.  He has been taking Tylenol, Motrin with some improvement in fever.  He has been taking Mucinex, but does not feel as if this is helping his congestion.  He also has been using his home inhaler and nebulizer treatments.  He feels as if treatments work for about 10-15 minutes, but then symptoms immediately return.    Past Medical History:  Diagnosis Date  . Asthma   . Bronchitis     There are no active problems to display for this patient.   History reviewed. No pertinent surgical history.      Home Medications    Prior to Admission medications   Medication Sig Start Date End Date Taking? Authorizing Provider  albuterol (PROVENTIL) (2.5 MG/3ML) 0.083% nebulizer solution Take 2.5 mg by nebulization every 6 (six) hours as needed for wheezing or shortness of breath.   Yes [provider]  acetaminophen (TYLENOL) 500 MG tablet Take 1 tablet (500 mg total) by mouth every 6 (six) hours as needed for moderate  pain. Patient not taking: Reported on 09/04/2017 12/27/16   Barbaraann BarthelBetancourt, Tina A, NP  albuterol (PROVENTIL HFA;VENTOLIN HFA) 108 (90 Base) MCG/ACT inhaler Inhale 1-2 puffs into the lungs every 6 (six) hours as needed for wheezing or shortness of breath. 09/05/17   Khoury Siemon, Chase PicketJaime Pilcher, PA-C  azithromycin (ZITHROMAX Z-PAK) 250 MG tablet Please take first 2 tablets today, then 1 daily for the following 4 days Patient not taking: Reported on 06/27/2017 06/19/17   Wieters, Hallie C, PA-C  predniSONE (DELTASONE) 20 MG tablet Take 2 tablets (40 mg total) by mouth daily. 09/05/17   Olivea Sonnen, Chase PicketJaime Pilcher, PA-C    Family History No family history on file.  Social History Social History   Tobacco Use  . Smoking status: Never Smoker  . Smokeless tobacco: Never Used  Substance Use Topics  . Alcohol use: Yes    Comment: once a month  . Drug use: Yes    Frequency: 0.5 times per week    Types: Marijuana     Allergies   Shellfish allergy   Review of Systems Review of Systems  Constitutional: Positive for chills and fever.  HENT: Positive for congestion. Negative for sore throat.   Respiratory: Positive for cough, shortness of breath and wheezing.   Gastrointestinal: Negative for abdominal pain, diarrhea, nausea and vomiting.  All other systems reviewed and are negative.    Physical Exam Updated Vital Signs BP 129/75   Pulse 79   Temp (!) 100.9 F (  38.3 C) (Oral)   Resp (!) 25   Ht 6\' 2"  (1.88 m)   Wt 95.3 kg (210 lb)   SpO2 95%   BMI 26.96 kg/m   Physical Exam  Constitutional: He is oriented to person, place, and time. He appears well-developed and well-nourished. No distress.  HENT:  Head: Normocephalic and atraumatic.  Mouth/Throat: Oropharynx is clear and moist.  Neck: Neck supple.  Cardiovascular: Normal heart sounds.  No murmur heard. Tachycardic but regular.  Pulmonary/Chest: No respiratory distress.  Expiratory wheezing throughout bilateral lung fields.  Speaking in full  sentences without difficulty.  Abdominal: Soft. He exhibits no distension. There is no tenderness.  Neurological: He is alert and oriented to person, place, and time.  Skin: Skin is warm and dry.  Nursing note and vitals reviewed.    ED Treatments / Results  Labs (all labs ordered are listed, but only abnormal results are displayed) Labs Reviewed  INFLUENZA PANEL BY PCR (TYPE A & B)    EKG None  Radiology Dg Chest 2 View  Result Date: 09/04/2017 CLINICAL DATA:  Shortness of breath. EXAM: CHEST - 2 VIEW COMPARISON:  Radiographs of June 26, 2017. FINDINGS: The heart size and mediastinal contours are within normal limits. Both lungs are clear. No pneumothorax or pleural effusion is noted. The visualized skeletal structures are unremarkable. IMPRESSION: No active cardiopulmonary disease. Electronically Signed   By: Lupita Raider, M.D.   On: 09/04/2017 21:57    Procedures Procedures (including critical care time)  Medications Ordered in ED Medications  albuterol (PROVENTIL) (2.5 MG/3ML) 0.083% nebulizer solution 5 mg (5 mg Nebulization Given 09/04/17 2117)  ibuprofen (ADVIL,MOTRIN) tablet 800 mg (800 mg Oral Given 09/04/17 2257)  ipratropium-albuterol (DUONEB) 0.5-2.5 (3) MG/3ML nebulizer solution 3 mL (3 mLs Nebulization Given 09/04/17 2256)  predniSONE (DELTASONE) tablet 60 mg (60 mg Oral Given 09/04/17 2256)  ipratropium-albuterol (DUONEB) 0.5-2.5 (3) MG/3ML nebulizer solution 3 mL (3 mLs Nebulization Given 09/05/17 0123)     Initial Impression / Assessment and Plan / ED Course  I have reviewed the triage vital signs and the nursing notes.  Pertinent labs & imaging results that were available during my care of the patient were reviewed by me and considered in my medical decision making (see chart for details).    Jawann Urbani is a 27 y.o. male who presents to ED for fever, chills, cough, congestion.  History of asthma.  On exam, patient is febrile with temperature of 100.9, mildly  tachycardic.  He has expiratory wheezing throughout bilateral lung fields. CXR negative.  Has had one breathing treatment prior to my evaluation with some, however minimal, improvement.  Will give steroids, DuoNeb, antipyretics and check for flu.   Flu negative.   Patient re-evaluated and feels much improved. Wheezing improved. He feels comfortable with discharge to home. Rx for prednisone burst and refill of home inhaler given. Alternate tylenol/motrin PRN fevers. Discussed low threshold to return to ER if symptoms worsen. He understands. PCP follow up encouraged. All questions answered.   Final Clinical Impressions(s) / ED Diagnoses   Final diagnoses:  Viral upper respiratory infection  Exacerbation of asthma, unspecified asthma severity, unspecified whether persistent    ED Discharge Orders        Ordered    predniSONE (DELTASONE) 20 MG tablet  Daily     09/05/17 0147    albuterol (PROVENTIL HFA;VENTOLIN HFA) 108 (90 Base) MCG/ACT inhaler  Every 6 hours PRN     09/05/17 0147  Mariha Sleeper, Chase Picket, PA-C 09/05/17 0153    Clarene Duke Ambrose Finland, MD 09/05/17 [phone]

## 2017-09-04 NOTE — ED Triage Notes (Signed)
Pt reports congestion, cough, fever, body aches, and difficulty breathing. Pt has hx of asthma and has been using at home inhalers and nebulizer treatment without relief. Unable to tell me what at home temperature has been but pt is febrile in triage. Last took 650mg  tylenol around 7pm

## 2017-09-05 LAB — INFLUENZA PANEL BY PCR (TYPE A & B)
Influenza A By PCR: NEGATIVE
Influenza B By PCR: NEGATIVE

## 2017-09-05 MED ORDER — PREDNISONE 20 MG PO TABS: 40.0000 mg | ORAL_TABLET | Freq: Every day | ORAL | 0 refills | Status: DC

## 2017-09-05 MED ORDER — IPRATROPIUM-ALBUTEROL 0.5-2.5 (3) MG/3ML IN SOLN
3.0000 mL | Freq: Once | RESPIRATORY_TRACT | Status: AC
  Administered 2017-09-05: 3 mL via RESPIRATORY_TRACT
  Filled 2017-09-05: qty 3

## 2017-09-05 MED ORDER — ALBUTEROL SULFATE HFA 108 (90 BASE) MCG/ACT IN AERS: 1.0000 | INHALATION_SPRAY | Freq: Four times a day (QID) | RESPIRATORY_TRACT | 0 refills | Status: DC | PRN

## 2017-09-05 NOTE — Discharge Instructions (Signed)
It was my pleasure taking care of you today!   Take prednisone daily starting tomorrow morning. Alternate between Tylenol and Ibuprofen as needed for fevers.  I have given you a refill of your home inhaler.   Follow up with your primary care provider for further discussion of today's ER visit.   If you develop any new or worsening symptoms, including but not limited to persistent vomiting, worsening shortness of breath or other symptoms that concern you, please return to the Emergency Department immediately.

## 2017-09-05 NOTE — ED Notes (Signed)
Contacted [facility] over flu swab

## 2018-01-02 ENCOUNTER — Ambulatory Visit (HOSPITAL_COMMUNITY)
Admission: EM | Admit: 2018-01-02 | Discharge: 2018-01-02 | Disposition: A | Discharge status: Home / Self Care | Payer: No Typology Code available for payment source | Attending: Family Medicine | Admitting: Family Medicine

## 2018-01-02 ENCOUNTER — Encounter (HOSPITAL_COMMUNITY): Payer: Self-pay

## 2018-01-02 DIAGNOSIS — B9789 Other viral agents as the cause of diseases classified elsewhere: Secondary | ICD-10-CM

## 2018-01-02 DIAGNOSIS — J029 Acute pharyngitis, unspecified: Secondary | ICD-10-CM

## 2018-01-02 DIAGNOSIS — J069 Acute upper respiratory infection, unspecified: Secondary | ICD-10-CM | POA: Diagnosis not present

## 2018-01-02 DIAGNOSIS — J4541 Moderate persistent asthma with (acute) exacerbation: Secondary | ICD-10-CM | POA: Diagnosis not present

## 2018-01-02 DIAGNOSIS — R05 Cough: Secondary | ICD-10-CM

## 2018-01-02 MED ORDER — ALBUTEROL SULFATE HFA 108 (90 BASE) MCG/ACT IN AERS: 1.0000 | INHALATION_SPRAY | Freq: Four times a day (QID) | RESPIRATORY_TRACT | 0 refills | Status: DC | PRN

## 2018-01-02 MED ORDER — GUAIFENESIN 100 MG/5ML PO LIQD: 100.0000 mg | ORAL | 0 refills | Status: DC | PRN

## 2018-01-02 MED ORDER — IPRATROPIUM-ALBUTEROL 0.5-2.5 (3) MG/3ML IN SOLN
RESPIRATORY_TRACT | Status: AC
  Filled 2018-01-02: qty 3

## 2018-01-02 MED ORDER — ACETAMINOPHEN 325 MG PO TABS
ORAL_TABLET | ORAL | Status: AC
  Filled 2018-01-02: qty 2

## 2018-01-02 MED ORDER — ACETAMINOPHEN 325 MG PO TABS
650.0000 mg | ORAL_TABLET | Freq: Once | ORAL | Status: AC
  Administered 2018-01-02: 650 mg via ORAL

## 2018-01-02 MED ORDER — IPRATROPIUM-ALBUTEROL 0.5-2.5 (3) MG/3ML IN SOLN
3.0000 mL | Freq: Once | RESPIRATORY_TRACT | Status: AC
  Administered 2018-01-02: 3 mL via RESPIRATORY_TRACT

## 2018-01-02 MED ORDER — PREDNISONE 20 MG PO TABS: 40.0000 mg | ORAL_TABLET | Freq: Every day | ORAL | 0 refills | Status: AC

## 2018-01-02 NOTE — Discharge Instructions (Signed)
It was nice meeting you!!  I believe that you have an upper respiratory infection that is exacerbating your asthma.  I will give you a short burst of steroids, cough medicine and an albuterol inhaler.  Follow up if no improvement in the next week.

## 2018-01-02 NOTE — ED Provider Notes (Signed)
[facility]    CSN: [id number] Arrival date & time: 01/02/18  1307     History   Chief Complaint Chief Complaint  Patient presents with  . URI    cold symptoms, headache, chills, body ache    HPI Kingson Lohmeyer is a 27 y.o. male.   Is a 27 year old male with past medical history of asthma, bronchitis.  He presents with 1 day of cough, congestion, shortness of breath, fever, chills, body aches, sore throat.  He used his inhaler last night with minimal relief and took Tylenol for his fever. He denies any recent sick contacts, recent travels.  He denies any rashes or insect bites.   ROS per HPI      Past Medical History:  Diagnosis Date  . Asthma   . Bronchitis     There are no active problems to display for this patient.   History reviewed. No pertinent surgical history.     Home Medications    Prior to Admission medications   Medication Sig Start Date End Date Taking? Authorizing Provider  acetaminophen (TYLENOL) 500 MG tablet Take 1 tablet (500 mg total) by mouth every 6 (six) hours as needed for moderate pain. Patient not taking: Reported on 09/04/2017 12/27/16   Barbaraann Barthel, NP  albuterol (PROVENTIL HFA;VENTOLIN HFA) 108 (90 Base) MCG/ACT inhaler Inhale 1-2 puffs into the lungs every 6 (six) hours as needed for wheezing or shortness of breath. 01/02/18   Dahlia Byes A, NP  azithromycin (ZITHROMAX Z-PAK) 250 MG tablet Please take first 2 tablets today, then 1 daily for the following 4 days Patient not taking: Reported on 06/27/2017 06/19/17   Wieters, Hallie C, PA-C  guaiFENesin (ROBITUSSIN) 100 MG/5ML liquid Take 5-10 mLs (100-200 mg total) by mouth every 4 (four) hours as needed for cough. 01/02/18   Dahlia Byes A, NP  predniSONE (DELTASONE) 20 MG tablet Take 2 tablets (40 mg total) by mouth daily for 5 days. 01/02/18 01/07/18  Janace Aris, NP    Family History History reviewed. No pertinent family history.  Social History Social History   Tobacco  Use  . Smoking status: Never Smoker  . Smokeless tobacco: Never Used  Substance Use Topics  . Alcohol use: Yes    Comment: once a month  . Drug use: Yes    Frequency: 0.5 times per week    Types: Marijuana     Allergies   Shellfish allergy   Review of Systems Review of Systems   Physical Exam Triage Vital Signs ED Triage Vitals [01/02/18 1319]  Enc Vitals Group     BP (!) 146/82     Pulse Rate 100     Resp 20     Temp 100.2 F (37.9 C)     Temp Source Oral     SpO2 95 %     Weight      Height      Head Circumference      Peak Flow      Pain Score      Pain Loc      Pain Edu?      Excl. in GC?    No data found.  Updated Vital Signs BP (!) 146/82 (BP Location: Right Arm)   Pulse 100   Temp 100.2 F (37.9 C) (Oral)   Resp 20   SpO2 95%   Visual Acuity Right Eye Distance:   Left Eye Distance:   Bilateral Distance:    Right Eye Near:  Left Eye Near:    Bilateral Near:     Physical Exam  Constitutional: He is oriented to person, place, and time. He appears well-developed and well-nourished. No distress.  HENT:  Head: Normocephalic and atraumatic.  Right Ear: External ear normal.  Left Ear: External ear normal.  Nose: Nose normal.  Mouth/Throat: No oropharyngeal exudate.  Mild posterior oropharyngeal erythema. No tonsillar swelling or exudates.   Neck: Normal range of motion.  Cardiovascular: Normal rate and regular rhythm.  Mildly tachycardic.   Pulmonary/Chest: Effort normal.  Inspiratory and expiratory wheezing throughout lung fields. No distress. Speaking in full sentences.   Lymphadenopathy:    He has no cervical adenopathy.  Neurological: He is alert and oriented to person, place, and time.  Skin: Skin is warm and dry. Capillary refill takes less than 2 seconds.  Psychiatric: He has a normal mood and affect.  Nursing note and vitals reviewed.    UC Treatments / Results  Labs (all labs ordered are listed, but only abnormal results are  displayed) Labs Reviewed - No data to display  EKG None  Radiology No results found.  Procedures Procedures (including critical care time)  Medications Ordered in UC Medications  ipratropium-albuterol (DUONEB) 0.5-2.5 (3) MG/3ML nebulizer solution 3 mL (3 mLs Nebulization Given 01/02/18 1342)  acetaminophen (TYLENOL) tablet 650 mg (650 mg Oral Given 01/02/18 1342)    Initial Impression / Assessment and Plan / UC Course  I have reviewed the triage vital signs and the nursing notes.  Pertinent labs & imaging results that were available during my care of the patient were reviewed by me and considered in my medical decision making (see chart for details).     Will give Duo Neb in clinic and reassess. Most likely URI exacerbating his asthma. Will prob do a short burst of steroids and refill albuterol inhaler outpatient.   Lungs clear after DuoNeb.  We will send patient home with prescriptions and follow-up as needed if not getting better. Return precautions given. Pt agreeable to plan.  Final Clinical Impressions(s) / UC Diagnoses   Final diagnoses:  Viral URI with cough  Moderate persistent asthma with acute exacerbation     Discharge Instructions     It was nice meeting you!!  I believe that you have an upper respiratory infection that is exacerbating your asthma.  I will give you a short burst of steroids, cough medicine and an albuterol inhaler.  Follow up if no improvement in the next week.     ED Prescriptions    Medication Sig Dispense Auth. Provider   predniSONE (DELTASONE) 20 MG tablet Take 2 tablets (40 mg total) by mouth daily for 5 days. 10 tablet Heavan Francom A, NP   albuterol (PROVENTIL HFA;VENTOLIN HFA) 108 (90 Base) MCG/ACT inhaler Inhale 1-2 puffs into the lungs every 6 (six) hours as needed for wheezing or shortness of breath. 1 Inhaler [place] Nolton A, NP   guaiFENesin (ROBITUSSIN) 100 MG/5ML liquid Take 5-10 mLs (100-200 mg total) by mouth every 4 (four) hours  as needed for cough. 60 mL Dahlia ByesBast, Delayni Streed A, NP     Controlled Substance Prescriptions [place] Controlled Substance Registry consulted? Not Applicable   Janace ArisBast, Makaylia Hewett A, NP 01/02/18 1601

## 2018-01-02 NOTE — ED Triage Notes (Signed)
Pt presents with cold symptoms, headache, chills, body ache

## 2018-02-12 ENCOUNTER — Encounter (HOSPITAL_COMMUNITY): Payer: Self-pay

## 2018-02-12 ENCOUNTER — Other Ambulatory Visit: Payer: Self-pay

## 2018-02-12 ENCOUNTER — Ambulatory Visit (HOSPITAL_COMMUNITY)
Admission: EM | Admit: 2018-02-12 | Discharge: 2018-02-12 | Disposition: A | Discharge status: Home / Self Care | Payer: No Typology Code available for payment source | Attending: Family Medicine | Admitting: Family Medicine

## 2018-02-12 DIAGNOSIS — J4521 Mild intermittent asthma with (acute) exacerbation: Secondary | ICD-10-CM | POA: Diagnosis not present

## 2018-02-12 MED ORDER — PREDNISONE 10 MG (21) PO TBPK: ORAL_TABLET | Freq: Every day | ORAL | 0 refills | Status: DC

## 2018-02-20 NOTE — ED Provider Notes (Signed)
[facility]   [id number] 02/12/18 Arrival Time: 1857  ASSESSMENT & PLAN:  1. Mild intermittent asthma with exacerbation    Nebulizer treatment needed: no.  Meds ordered this encounter  Medications  . predniSONE (STERAPRED UNI-PAK 21 TAB) 10 MG (21) TBPK tablet    Sig: Take by mouth daily. Take as directed.    Dispense:  21 tablet    Refill:  0   Has albuterol inhaler. Asthma precautions given. OTC symptom care as needed. May f/u with PCP or here as needed.  Reviewed expectations re: course of current medical issues. Questions answered. Outlined signs and symptoms indicating need for more acute intervention. Patient verbalized understanding. After Visit Summary given.  SUBJECTIVE: History from: patient.  Carlton AdamDouglas Mckneely is a 27 y.o. male who presents with complaint of intermittent wheezing. Triggers: None. Onset gradual, approximately several days ago. Describes wheezing as mild to moderate when present. Fever: no. Overall normal PO intake without n/v. Sick contacts: no. Typically his asthma is well controlled. Inhaler use: albuterol prn with temporary relief; using more frequently over the past few days. OTC treatment: none.  Received flu shot this year: no.  Social History   Tobacco Use  Smoking Status Never Smoker  Smokeless Tobacco Never Used    ROS: As per HPI.   OBJECTIVE:  Vitals:   02/12/18 1945  BP: (!) 137/91  Pulse: 75  Resp: 18  Temp: 97.8 F (36.6 C)  SpO2: 98%     General appearance: alert; appears fatigued HEENT: nasal congestion; clear runny nose; throat irritation secondary to post-nasal drainage Neck: supple without LAD Lungs: unlabored respirations, mild bilateral expiratory wheezing; cough: mild; no significant respiratory distress Skin: warm and dry Psychological: alert and cooperative; normal mood and affect  Allergies  Allergen Reactions  . Shellfish Allergy Swelling    Throat swelling    Past Medical History:    Diagnosis Date  . Asthma   . Bronchitis    History reviewed. No pertinent family history. Social History   Socioeconomic History  . Marital status: Single    Spouse name: Not on file  . Number of children: Not on file  . Years of education: Not on file  . Highest education level: Not on file  Occupational History  . Not on file  Social Needs  . Financial resource strain: Not on file  . Food insecurity:    Worry: Not on file    Inability: Not on file  . Transportation needs:    Medical: Not on file    Non-medical: Not on file  Tobacco Use  . Smoking status: Never Smoker  . Smokeless tobacco: Never Used  Substance and Sexual Activity  . Alcohol use: Yes    Comment: once a month  . Drug use: Yes    Frequency: 0.5 times per week    Types: Marijuana  . Sexual activity: Not on file  Lifestyle  . Physical activity:    Days per week: Not on file    Minutes per session: Not on file  . Stress: Not on file  Relationships  . Social connections:    Talks on phone: Not on file    Gets together: Not on file    Attends religious service: Not on file    Active member of club or organization: Not on file    Attends meetings of clubs or organizations: Not on file    Relationship status: Not on file  . Intimate partner violence:    Fear  of current or ex partner: Not on file    Emotionally abused: Not on file    Physically abused: Not on file    Forced sexual activity: Not on file  Other Topics Concern  . Not on file  Social History Narrative  . Not on file            Mardella Layman, MD 02/20/18 [phone]

## 2018-04-14 ENCOUNTER — Other Ambulatory Visit: Payer: Self-pay

## 2018-04-14 ENCOUNTER — Ambulatory Visit (HOSPITAL_COMMUNITY)
Admission: EM | Admit: 2018-04-14 | Discharge: 2018-04-14 | Disposition: A | Discharge status: Home / Self Care | Payer: No Typology Code available for payment source | Attending: Family Medicine | Admitting: Family Medicine

## 2018-04-14 ENCOUNTER — Encounter (HOSPITAL_COMMUNITY): Payer: Self-pay | Admitting: Emergency Medicine

## 2018-04-14 DIAGNOSIS — J00 Acute nasopharyngitis [common cold]: Secondary | ICD-10-CM

## 2018-04-14 LAB — POCT RAPID STREP A: Streptococcus, Group A Screen (Direct): NEGATIVE

## 2018-04-14 MED ORDER — IBUPROFEN 400 MG PO TABS: 400.0000 mg | ORAL_TABLET | Freq: Four times a day (QID) | ORAL | 0 refills | Status: DC | PRN

## 2018-04-14 MED ORDER — FLUTICASONE PROPIONATE 50 MCG/ACT NA SUSP: 1.0000 | Freq: Every day | NASAL | 2 refills | Status: DC

## 2018-04-14 MED ORDER — GUAIFENESIN ER 600 MG PO TB12: 1200.0000 mg | ORAL_TABLET | Freq: Two times a day (BID) | ORAL | 0 refills | Status: AC | PRN

## 2018-04-14 NOTE — Discharge Instructions (Addendum)
Negative rapid strep here today. This appears to be viral in nature.  Tylenol and/or ibuprofen as needed for pain. Ibuprofen may be more helpful with headache.  Mucinex as an expectorant to loosen secretions.  Daily flonase to help with nasal congestion.  Push fluids to ensure adequate hydration and keep secretions thin.  If symptoms worsen or do not improve in the next week to return to be seen or to follow up with your PCP.

## 2018-04-14 NOTE — ED Triage Notes (Signed)
The patient presented to the [facility] with a complaint of a cough, headache and sore throat x 3 days. The patient denied any known fever.

## 2018-04-14 NOTE — ED Provider Notes (Signed)
[facility]    CSN: [id number] Arrival date & time: 04/14/18  1630     History   Chief Complaint Chief Complaint  Patient presents with  . Sore Throat    HPI Colton Fowler is a 27 y.o. male.   Danta presents with complaints of sore throat, headache, runny nose and body aches which started ~4 days ago. No fevers. Slight cough. No gi/gu complaints. No rash. No known ill contacts. Symptoms wax and wane. Has been taking tylenol which helps some, last 2 hours ago. Hx of asthma, bronchitis.     ROS per HPI.      Past Medical History:  Diagnosis Date  . Asthma   . Bronchitis     There are no active problems to display for this patient.   History reviewed. No pertinent surgical history.     Home Medications    Prior to Admission medications   Medication Sig Start Date End Date Taking? Authorizing Provider  acetaminophen (TYLENOL) 500 MG tablet Take 1 tablet (500 mg total) by mouth every 6 (six) hours as needed for moderate pain. 12/27/16  Yes Betancourt, Jarold Song, NP  fluticasone (FLONASE) 50 MCG/ACT nasal spray Place 1 spray into both nostrils daily. 04/14/18   Georgetta Haber, NP  guaiFENesin (MUCINEX) 600 MG 12 hr tablet Take 2 tablets (1,200 mg total) by mouth 2 (two) times daily as needed for up to 5 days. 04/14/18 04/19/18  Georgetta Haber, NP  ibuprofen (ADVIL,MOTRIN) 400 MG tablet Take 1 tablet (400 mg total) by mouth every 6 (six) hours as needed. 04/14/18   Georgetta Haber, NP    Family History History reviewed. No pertinent family history.  Social History Social History   Tobacco Use  . Smoking status: Never Smoker  . Smokeless tobacco: Never Used  Substance Use Topics  . Alcohol use: Yes    Comment: once a month  . Drug use: Yes    Frequency: 0.5 times per week    Types: Marijuana     Allergies   Shellfish allergy   Review of Systems Review of Systems   Physical Exam Triage Vital Signs ED Triage Vitals  Enc Vitals  Group     BP 04/14/18 1720 126/74     Pulse Rate 04/14/18 1720 60     Resp 04/14/18 1720 18     Temp 04/14/18 1720 98.4 F (36.9 C)     Temp Source 04/14/18 1720 Oral     SpO2 04/14/18 1720 98 %     Weight --      Height --      Head Circumference --      Peak Flow --      Pain Score 04/14/18 1718 4     Pain Loc --      Pain Edu? --      Excl. in GC? --    No data found.  Updated Vital Signs BP 126/74 (BP Location: Left Arm)   Pulse 60   Temp 98.4 F (36.9 C) (Oral)   Resp 18   SpO2 98%    Physical Exam  Constitutional: He is oriented to person, place, and time. He appears well-developed and well-nourished.  HENT:  Head: Normocephalic and atraumatic.  Right Ear: Tympanic membrane, external ear and ear canal normal.  Left Ear: Tympanic membrane, external ear and ear canal normal.  Nose: Nose normal. Right sinus exhibits no maxillary sinus tenderness and no frontal sinus tenderness. Left sinus exhibits no  maxillary sinus tenderness and no frontal sinus tenderness.  Mouth/Throat: Uvula is midline and mucous membranes are normal. Posterior oropharyngeal erythema present. Tonsils are 1+ on the right. Tonsils are 1+ on the left. Tonsillar exudate.  Small exudate to right tonsil noted   Eyes: Pupils are equal, round, and reactive to light. Conjunctivae are normal.  Neck: Normal range of motion.  Cardiovascular: Normal rate and regular rhythm.  Pulmonary/Chest: Effort normal and breath sounds normal.  Lymphadenopathy:    He has no cervical adenopathy.  Neurological: He is alert and oriented to person, place, and time.  Skin: Skin is warm and dry.  Vitals reviewed.    UC Treatments / Results  Labs (all labs ordered are listed, but only abnormal results are displayed) Labs Reviewed  CULTURE, GROUP A STREP [facility])  POCT RAPID STREP A    EKG None  Radiology No results found.  Procedures Procedures (including critical care time)  Medications Ordered in  UC Medications - No data to display  Initial Impression / Assessment and Plan / UC Course  I have reviewed the triage vital signs and the nursing notes.  Pertinent labs & imaging results that were available during my care of the patient were reviewed by me and considered in my medical decision making (see chart for details).     Negative rapid strep. History and physical consistent with viral illness.  Supportive cares recommended. If symptoms worsen or do not improve in the next week to return to be seen or to follow up with PCP.  Patient verbalized understanding and agreeable to plan.   Final Clinical Impressions(s) / UC Diagnoses   Final diagnoses:  Nasopharyngitis     Discharge Instructions     Negative rapid strep here today. This appears to be viral in nature.  Tylenol and/or ibuprofen as needed for pain. Ibuprofen may be more helpful with headache.  Mucinex as an expectorant to loosen secretions.  Daily flonase to help with nasal congestion.  Push fluids to ensure adequate hydration and keep secretions thin.  If symptoms worsen or do not improve in the next week to return to be seen or to follow up with your PCP.       ED Prescriptions    Medication Sig Dispense Auth. Provider   guaiFENesin (MUCINEX) 600 MG 12 hr tablet Take 2 tablets (1,200 mg total) by mouth 2 (two) times daily as needed for up to 5 days. 20 tablet Linus Mako B, NP   fluticasone (FLONASE) 50 MCG/ACT nasal spray Place 1 spray into both nostrils daily. 16 g Linus Mako B, NP   ibuprofen (ADVIL,MOTRIN) 400 MG tablet Take 1 tablet (400 mg total) by mouth every 6 (six) hours as needed. 30 tablet Georgetta Haber, NP     Controlled Substance Prescriptions [place] Controlled Substance Registry consulted? Not Applicable   Georgetta Haber, NP 04/14/18 [phone]

## 2018-04-17 LAB — CULTURE, GROUP A STREP (THRC)

## 2018-06-21 ENCOUNTER — Encounter (HOSPITAL_COMMUNITY): Payer: Self-pay

## 2018-06-21 ENCOUNTER — Ambulatory Visit (HOSPITAL_COMMUNITY)
Admission: EM | Admit: 2018-06-21 | Discharge: 2018-06-21 | Disposition: A | Discharge status: Home / Self Care | Payer: Self-pay | Attending: Family Medicine | Admitting: Family Medicine

## 2018-06-21 ENCOUNTER — Other Ambulatory Visit: Payer: Self-pay

## 2018-06-21 DIAGNOSIS — L259 Unspecified contact dermatitis, unspecified cause: Secondary | ICD-10-CM

## 2018-06-21 MED ORDER — PREDNISONE 10 MG (21) PO TBPK: ORAL_TABLET | Freq: Every day | ORAL | 0 refills | Status: DC

## 2018-06-21 NOTE — ED Triage Notes (Addendum)
Pt cc he has a rash on his right forearm for a week now.

## 2018-06-23 NOTE — ED Provider Notes (Signed)
[facility]   [id number] 06/21/18 Arrival Time: 1018  ASSESSMENT & PLAN:  1. Contact dermatitis, unspecified contact dermatitis type, unspecified trigger    No sign of bacterial skin infection. Discussed s/s to watch for.  Trial of: Meds ordered this encounter  Medications  . predniSONE (STERAPRED UNI-PAK 21 TAB) 10 MG (21) TBPK tablet    Sig: Take by mouth daily. Take as directed.    Dispense:  21 tablet    Refill:  0   Will follow up with PCP or here if worsening or failing to improve as anticipated. Reviewed expectations re: course of current medical issues. Questions answered. Outlined signs and symptoms indicating need for more acute intervention. Patient verbalized understanding. After Visit Summary given.   SUBJECTIVE:  Colton Fowler is a 28 y.o. male who presents with a skin complaint.   Location: R forearm Onset: abrupt Duration: 5-6 days ago Associated pruritis? Mild currently but does describe times with intense itching. Associated pain? none Progression: stable  Drainage? No  Known trigger? No  New soaps/lotions/topicals/detergents/environmental exposures? No Contacts with similar? No Recent travel? No  Other associated symptoms: none Therapies tried thus far: OTC cortisone cream without much help. Arthralgia or myalgia? none Recent illness? none Fever? none No specific aggravating or alleviating factors reported.  ROS: As per HPI.  OBJECTIVE: Vitals:   06/21/18 1102 06/21/18 1103  BP:  120/77  Pulse:  89  Resp:  18  Temp:  98.5 F (36.9 C)  TempSrc:  Oral  SpO2:  98%  Weight: 99.8 kg     General appearance: alert; no distress HEENT: no oral lesions Lungs: clear to auscultation bilaterally Heart: regular rate and rhythm Extremities: no edema Skin: warm and dry; signs of infection: no; over distal right forearm are two slightly oval patches of deep erythema measuring approx 3x4 cm each; few overlying excoriations; areas are  slightly raised; no areas of fluctuance; no significant warmth to touch; non-tender to touch Psychological: alert and cooperative; normal mood and affect  Allergies  Allergen Reactions  . Shellfish Allergy Swelling    Throat swelling    Past Medical History:  Diagnosis Date  . Asthma   . Bronchitis    Social History   Socioeconomic History  . Marital status: Single    Spouse name: Not on file  . Number of children: Not on file  . Years of education: Not on file  . Highest education level: Not on file  Occupational History  . Not on file  Social Needs  . Financial resource strain: Not on file  . Food insecurity:    Worry: Not on file    Inability: Not on file  . Transportation needs:    Medical: Not on file    Non-medical: Not on file  Tobacco Use  . Smoking status: Never Smoker  . Smokeless tobacco: Never Used  Substance and Sexual Activity  . Alcohol use: Yes    Comment: once a month  . Drug use: Yes    Frequency: 0.5 times per week    Types: Marijuana  . Sexual activity: Not on file  Lifestyle  . Physical activity:    Days per week: Not on file    Minutes per session: Not on file  . Stress: Not on file  Relationships  . Social connections:    Talks on phone: Not on file    Gets together: Not on file    Attends religious service: Not on file    Active  member of club or organization: Not on file    Attends meetings of clubs or organizations: Not on file    Relationship status: Not on file  . Intimate partner violence:    Fear of current or ex partner: Not on file    Emotionally abused: Not on file    Physically abused: Not on file    Forced sexual activity: Not on file  Other Topics Concern  . Not on file  Social History Narrative  . Not on file   History reviewed. No pertinent family history. History reviewed. No pertinent surgical history.   Mardella Layman, MD 06/23/18 [phone]

## 2019-02-19 ENCOUNTER — Ambulatory Visit (HOSPITAL_COMMUNITY)
Admission: EM | Admit: 2019-02-19 | Discharge: 2019-02-19 | Disposition: A | Discharge status: Home / Self Care | Payer: Self-pay

## 2019-02-19 ENCOUNTER — Emergency Department (HOSPITAL_COMMUNITY)
Admission: EM | Admit: 2019-02-19 | Discharge: 2019-02-20 | Disposition: A | Discharge status: Home / Self Care | Payer: Medicaid Other | Attending: Emergency Medicine | Admitting: Emergency Medicine

## 2019-02-19 ENCOUNTER — Other Ambulatory Visit: Payer: Self-pay

## 2019-02-19 ENCOUNTER — Encounter (HOSPITAL_COMMUNITY): Payer: Self-pay | Admitting: Emergency Medicine

## 2019-02-19 ENCOUNTER — Emergency Department (HOSPITAL_COMMUNITY): Payer: Medicaid Other

## 2019-02-19 DIAGNOSIS — F1290 Cannabis use, unspecified, uncomplicated: Secondary | ICD-10-CM | POA: Insufficient documentation

## 2019-02-19 DIAGNOSIS — R072 Precordial pain: Secondary | ICD-10-CM

## 2019-02-19 DIAGNOSIS — R202 Paresthesia of skin: Secondary | ICD-10-CM

## 2019-02-19 DIAGNOSIS — R200 Anesthesia of skin: Secondary | ICD-10-CM | POA: Insufficient documentation

## 2019-02-19 LAB — URINALYSIS, ROUTINE W REFLEX MICROSCOPIC
Bilirubin Urine: NEGATIVE
Glucose, UA: NEGATIVE mg/dL
Hgb urine dipstick: NEGATIVE
Ketones, ur: 20 mg/dL — AB
Leukocytes,Ua: NEGATIVE
Nitrite: NEGATIVE
Protein, ur: NEGATIVE mg/dL
Specific Gravity, Urine: 1.028 (ref 1.005–1.030)
pH: 7 (ref 5.0–8.0)

## 2019-02-19 LAB — BASIC METABOLIC PANEL
Anion gap: 9 (ref 5–15)
BUN: 12 mg/dL (ref 6–20)
CO2: 26 mmol/L (ref 22–32)
Calcium: 9.5 mg/dL (ref 8.9–10.3)
Chloride: 102 mmol/L (ref 98–111)
Creatinine, Ser: 1.38 mg/dL — ABNORMAL HIGH (ref 0.61–1.24)
GFR calc Af Amer: >60 mL/min (ref >60)
GFR calc non Af Amer: >60 mL/min (ref >60)
Glucose, Bld: 91 mg/dL (ref 70–99)
Potassium: 3.8 mmol/L (ref 3.5–5.1)
Sodium: 137 mmol/L (ref 135–145)

## 2019-02-19 LAB — TROPONIN I (HIGH SENSITIVITY)
Troponin I (High Sensitivity): 4 ng/L (ref <18)
Troponin I (High Sensitivity): 4 ng/L (ref <18)

## 2019-02-19 LAB — CBC
HCT: 45.3 % (ref 39.0–52.0)
Hemoglobin: 15.6 g/dL (ref 13.0–17.0)
MCH: 29 pg (ref 26.0–34.0)
MCHC: 34.4 g/dL (ref 30.0–36.0)
MCV: 84.2 fL (ref 80.0–100.0)
Platelets: 225 10*3/uL (ref 150–400)
RBC: 5.38 MIL/uL (ref 4.22–5.81)
RDW: 14.6 % (ref 11.5–15.5)
WBC: 7.2 10*3/uL (ref 4.0–10.5)
nRBC: 0 % (ref 0.0–0.2)

## 2019-02-19 LAB — CBG MONITORING, ED: Glucose-Capillary: 101 mg/dL — ABNORMAL HIGH (ref 70–99)

## 2019-02-19 LAB — CK: Total CK: 255 U/L (ref 49–397)

## 2019-02-19 MED ORDER — SODIUM CHLORIDE 0.9% FLUSH: 3.0000 mL | Freq: Once | INTRAVENOUS | Status: DC

## 2019-02-19 NOTE — ED Notes (Signed)
Per Dr. Joseph Art patient is not a Code Stroke. Patient does need imaging not provided at Urgent Care. Patient escorted to the ED by this RN.

## 2019-02-19 NOTE — ED Notes (Signed)
Pt presents with complaints of numbness in his right hand. Patient states that he woke up at 0930 with numbness in his fingers in his right hand. Pt's arm felt normal at 0300 prior to going to sleep.  States that since waking up the numbness has gone from his elbow to his hand. Pt has no slurred speech, vision loss or extremity drift. Patient cannot form a fist with his right hand. Dr. Joseph Art at bedside.

## 2019-02-19 NOTE — ED Triage Notes (Addendum)
Pt arrives to ED from Urgent Care with complaints of right hand numbness since waking up at 0900 today. Patients last known normal is 0300 early this morning. When patient was at work today he stated that the numbness ran up his arm and he was unable to make a fist with the right hand. Patient also stated that while at work he had a couple of minutes of substernal chest pain. Patient currently has no speech deficits, arm drift, or vision issues. NIH of 0.

## 2019-02-20 NOTE — ED Provider Notes (Signed)
[facility] EMERGENCY DEPARTMENT Provider Note   CSN: [id number] Arrival date & time: 02/19/19  1435     History   Chief Complaint Chief Complaint  Patient presents with   Numbness   Chest Pain    HPI Colton Fowler is a 28 y.o. male.     The history is provided by the patient.  Chest Pain Pain location:  Substernal area Pain quality: pressure   Pain radiates to:  Does not radiate Onset quality:  Sudden Timing:  Constant Progression:  Resolved Chronicity:  New Relieved by:  None tried Worsened by:  Nothing Associated symptoms: numbness   Associated symptoms: no cough, no fever, no headache, no syncope and no vomiting   Patient reports that he woke up with numbness in his right ring and little finger.  He did had some numbness on his right forearm.  He does not think he slept on the arm.  He does heavy lifting at work, but denies any trauma.  He reports as the day went on his symptoms worsened while at work his right arm felt more numb and that he began having chest pain/shortness of breath.  The episode of chest pain has since resolved.  He reports continued numbness of the right arm mostly around the right ring and little finger No history of CAD/CVA/PE He Is a non-smoker.  He smokes marijuana occasionally.  Denies other drug use Past Medical History:  Diagnosis Date   Asthma    Bronchitis     There are no active problems to display for this patient.   History reviewed. No pertinent surgical history.      Home Medications    Prior to Admission medications   Medication Sig Start Date End Date Taking? Authorizing Provider  fluticasone (FLONASE) 50 MCG/ACT nasal spray Place 1 spray into both nostrils daily. Patient not taking: Reported on 06/21/2018 04/14/18 02/20/19  Zigmund Gottron, NP    Family History History reviewed. No pertinent family history.  Social History Social History   Tobacco Use   Smoking status: Never Smoker    Smokeless tobacco: Never Used  Substance Use Topics   Alcohol use: Yes    Comment: once a month   Drug use: Yes    Frequency: 0.5 times per week    Types: Marijuana     Allergies   Shellfish allergy   Review of Systems Review of Systems  Constitutional: Negative for fever.  Eyes: Negative for visual disturbance.  Respiratory: Negative for cough.   Cardiovascular: Positive for chest pain. Negative for syncope.  Gastrointestinal: Negative for vomiting.  Musculoskeletal: Positive for arthralgias. Negative for neck stiffness.       Reports mild right back and right shoulder pain  Neurological: Positive for numbness. Negative for facial asymmetry, speech difficulty and headaches.  All other systems reviewed and are negative.    Physical Exam Updated Vital Signs BP (!) 157/99 (BP Location: Right Arm)    Pulse 82    Temp 98.8 F (37.1 C) (Colton)    Resp 17    Ht 1.854 m (6\' 1" )    Wt 99.8 kg    SpO2 100%    BMI 29.03 kg/m   Physical Exam CONSTITUTIONAL: Well developed/well nourished HEAD: Normocephalic/atraumatic EYES: EOMI/PERRL, no nystagmus,no ptosis ENMT: Mucous membranes moist NECK: supple no meningeal signs, no bruits, mild right cervical paraspinal tenderness CV: S1/S2 noted, no murmurs/rubs/gallops noted LUNGS: Lungs are clear to auscultation bilaterally, no apparent distress ABDOMEN: soft, nontender, no  rebound or guarding GU:no cva tenderness NEURO:Awake/alert, face symmetric, no arm or leg drift is noted Equal 5/5 strength with shoulder abduction, elbow flex/extension, mild decrease in grip with right hand Equal 5/5 strength with hip flexion,knee flex/extension, foot dorsi/plantar flexion Cranial nerves 3/4/5/6/12/10/08/11/12 tested and intact Gait normal without ataxia Pt reports numbness to right ring/little finger and numbness to  Equal (2+) biceps/brachioradialis reflex in bilateral UE EXTREMITIES: pulses normal, full ROM, distal pulses equal/intact He has  tenderness to palpation of right elbow and pain with ROM of right elbow.  No bruising/swelling.   SKIN: warm, color normal PSYCH: no abnormalities of mood noted   ED Treatments / Results  Labs (all labs ordered are listed, but only abnormal results are displayed) Labs Reviewed  BASIC METABOLIC PANEL - Abnormal; Notable for the following components:      Result Value   Creatinine, Ser 1.38 (*)    All other components within normal limits  URINALYSIS, ROUTINE W REFLEX MICROSCOPIC - Abnormal; Notable for the following components:   Ketones, ur 20 (*)    All other components within normal limits  CBG MONITORING, ED - Abnormal; Notable for the following components:   Glucose-Capillary 101 (*)    All other components within normal limits  CBC  CK  TROPONIN I (HIGH SENSITIVITY)  TROPONIN I (HIGH SENSITIVITY)    EKG EKG Interpretation  Date/Time:  Thursday February 19 2019 14:42:38 EDT Ventricular Rate:  64 PR Interval:  146 QRS Duration: 88 QT Interval:  396 QTC Calculation: 408 R Axis:   85 Text Interpretation:  Normal sinus rhythm Normal ECG No significant change since last tracing Confirmed by Zadie RhineWickline, Raigan Baria ([id number]) on 02/20/2019 12:32:47 AM   Radiology Ct Head Wo Contrast  Result Date: 02/19/2019 CLINICAL DATA:  Ct head wo, complaints of right hand numbness since waking up at 0900 today. Patients last known normal is 0300 early this morning. When patient was at work today he stated that the numbness ran up his arm and he was unable to make a fist with the right hand EXAM: CT HEAD WITHOUT CONTRAST TECHNIQUE: Contiguous axial images were obtained from the base of the skull through the vertex without intravenous contrast. COMPARISON:  None. FINDINGS: Brain: No evidence of acute infarction, hemorrhage, hydrocephalus, extra-axial collection or mass lesion/mass effect. Vascular: No hyperdense vessel or unexpected calcification. Skull: Normal. Negative for fracture or focal lesion.  Sinuses/Orbits: Globes and orbits are within normal limits. Visualized sinuses and mastoid air cells are clear. Other: None. IMPRESSION: Normal unenhanced CT scan of the brain. Electronically Signed   By: Amie Portlandavid  Ormond M.D.   On: 02/19/2019 18:34    Procedures Procedures    Medications Ordered in ED Medications  sodium chloride flush (NS) 0.9 % injection 3 mL (3 mLs Intravenous Not Given 02/20/19 0046)     Initial Impression / Assessment and Plan / ED Course  I have reviewed the triage vital signs and the nursing notes.  Pertinent labs results that were available during my care of the patient were reviewed by me and considered in my medical decision making (see chart for details).        As for his chest pain, patient is low risk for ACS, troponin is negative.  No EKG changes, low suspicion for PE/Dissection.   For his paresthesias, strong suspicion this is a peripheral neuropathy, potentially ulnar  neuropathy. He had pain around the right elbow, and his symptoms seem to worsen with palpation of the right elbow as well  as movement of his right elbow.  Currently his numbness is reported to the right ring and pinky finger. I have low suspicion for acute stroke at this time. He will be referred to neurology if symptoms persist after 2 weeks. We discussed strict ER return precautions.  Patient is safe for discharge Final Clinical Impressions(s) / ED Diagnoses   Final diagnoses:  Paresthesia  Precordial pain    ED Discharge Orders    None       Zadie Rhine, MD 02/20/19 [phone]

## 2019-02-20 NOTE — ED Notes (Signed)
EDP explained tests results and plan of care to pt.  

## 2020-06-01 ENCOUNTER — Emergency Department (HOSPITAL_COMMUNITY)
Admission: EM | Admit: 2020-06-01 | Discharge: 2020-06-01 | Disposition: A | Discharge status: Home / Self Care | Payer: HRSA Program | Attending: Emergency Medicine | Admitting: Emergency Medicine

## 2020-06-01 ENCOUNTER — Encounter (HOSPITAL_COMMUNITY): Payer: Self-pay

## 2020-06-01 ENCOUNTER — Other Ambulatory Visit: Payer: Self-pay

## 2020-06-01 DIAGNOSIS — J45909 Unspecified asthma, uncomplicated: Secondary | ICD-10-CM | POA: Diagnosis not present

## 2020-06-01 DIAGNOSIS — U071 COVID-19: Secondary | ICD-10-CM | POA: Insufficient documentation

## 2020-06-01 DIAGNOSIS — R059 Cough, unspecified: Secondary | ICD-10-CM | POA: Diagnosis present

## 2020-06-01 LAB — RESP PANEL BY RT-PCR (FLU A&B, COVID) ARPGX2
Influenza A by PCR: NEGATIVE
Influenza B by PCR: NEGATIVE
SARS Coronavirus 2 by RT PCR: POSITIVE — AB

## 2020-06-01 MED ORDER — ACETAMINOPHEN 325 MG PO TABS
650.0000 mg | ORAL_TABLET | Freq: Once | ORAL | Status: AC | PRN
  Administered 2020-06-01: 650 mg via ORAL
  Filled 2020-06-01: qty 2

## 2020-06-01 NOTE — ED Triage Notes (Signed)
Pt reports being recently exposed to COVID. Pt now endorses chills and generalized body aches.

## 2020-06-01 NOTE — Discharge Instructions (Addendum)
Call your primary care doctor in the next 1-2 days to arrange video follow-up.  Use a finger pulse oximeter at home.  You may purchase one at [facility] or [facility] or online.  If numbers drops below 90%, return immediately back to the ER.  Otherwise increase your fluid intake, isolate at home, inform recent close contacts of the need to test for Covid.  Return immediately back to the ER if:  Your symptoms worsen within the next 12-24 hours. You develop new symptoms such as new fevers, persistent vomiting, new pain, shortness of breath, or new weakness or numbness, or if you have any other concerns.

## 2020-06-01 NOTE — ED Provider Notes (Signed)
[facility]-EMERGENCY DEPT Provider Note   CSN: [id number] Arrival date & time: 06/01/20  1627     History Chief Complaint  Patient presents with  . Generalized Body Aches  . Chills    Colton Fowler is a 29 y.o. male.  Patient presents with chief complaint of cough and congestion.  Symptoms started today around 3 PM.  He states he was exposed a few days ago with family members who may have had Covid.  Patient states he is unvaccinated at baseline.  No vomiting no diarrhea denies any pain or shortness of breath at this time.        Past Medical History:  Diagnosis Date  . Asthma   . Bronchitis     There are no problems to display for this patient.   History reviewed. No pertinent surgical history.     History reviewed. No pertinent family history.  Social History   Tobacco Use  . Smoking status: Never Smoker  . Smokeless tobacco: Never Used  Substance Use Topics  . Alcohol use: Yes    Comment: once a month  . Drug use: Yes    Frequency: 0.5 times per week    Types: Marijuana    Home Medications Prior to Admission medications   Medication Sig Start Date End Date Taking? Authorizing Provider  fluticasone (FLONASE) 50 MCG/ACT nasal spray Place 1 spray into both nostrils daily. Patient not taking: Reported on 06/21/2018 04/14/18 02/20/19  Georgetta Haber, NP    Allergies    Shellfish allergy  Review of Systems   Review of Systems  Constitutional: Negative for fever.  HENT: Negative for ear pain and sore throat.   Eyes: Negative for pain.  Respiratory: Positive for cough.   Cardiovascular: Negative for chest pain.  Gastrointestinal: Negative for abdominal pain.  Genitourinary: Negative for flank pain.  Musculoskeletal: Negative for back pain.  Skin: Negative for color change and rash.  Neurological: Negative for syncope.  All other systems reviewed and are negative.   Physical Exam Updated Vital Signs BP (!) 156/98 (BP  Location: Left Arm)   Pulse (!) 103   Temp 100 F (37.8 C) (Oral)   Resp 18   SpO2 96%   Physical Exam Constitutional:      General: He is not in acute distress.    Appearance: He is well-developed.  HENT:     Head: Normocephalic.     Right Ear: External ear normal.     Left Ear: External ear normal.  Eyes:     Extraocular Movements: Extraocular movements intact.  Cardiovascular:     Rate and Rhythm: Normal rate.  Pulmonary:     Effort: Pulmonary effort is normal.  Skin:    Capillary Refill: Capillary refill takes less than 2 seconds.     Coloration: Skin is not jaundiced.  Neurological:     General: No focal deficit present.     Mental Status: He is alert.     ED Results / Procedures / Treatments   Labs (all labs ordered are listed, but only abnormal results are displayed) Labs Reviewed  RESP PANEL BY RT-PCR (FLU A&B, COVID) ARPGX2 - Abnormal; Notable for the following components:      Result Value   SARS Coronavirus 2 by RT PCR POSITIVE (*)    All other components within normal limits    EKG None  Radiology No results found.  Procedures Procedures (including critical care time)  Medications Ordered in ED Medications  acetaminophen (  TYLENOL) tablet 650 mg (650 mg Oral Given 06/01/20 1712)    ED Course  I have reviewed the triage vital signs and the nursing notes.  Pertinent labs & imaging results that were available during my care of the patient were reviewed by me and considered in my medical decision making (see chart for details).    MDM Rules/Calculators/A&P                          Patient is positive for Covid here in the ER.  Advised isolation and rest at home advised increase fluid intake and finger pulse oximeter use.  Advised immediate return if the number drops below 90%.  Patient expressed understanding discharged home in stable condition.   Final Clinical Impression(s) / ED Diagnoses Final diagnoses:  COVID-19 virus infection    Rx  / DC Orders ED Discharge Orders    None       Cheryll Cockayne, MD 06/01/20 2009

## 2020-11-21 ENCOUNTER — Ambulatory Visit (HOSPITAL_COMMUNITY)
Admission: EM | Admit: 2020-11-21 | Discharge: 2020-11-21 | Disposition: A | Discharge status: Home / Self Care | Payer: Self-pay | Attending: Emergency Medicine | Admitting: Emergency Medicine

## 2020-11-21 ENCOUNTER — Other Ambulatory Visit: Payer: Self-pay

## 2020-11-21 ENCOUNTER — Encounter (HOSPITAL_COMMUNITY): Payer: Self-pay

## 2020-11-21 DIAGNOSIS — B349 Viral infection, unspecified: Secondary | ICD-10-CM

## 2020-11-21 DIAGNOSIS — U071 COVID-19: Secondary | ICD-10-CM | POA: Insufficient documentation

## 2020-11-21 NOTE — ED Triage Notes (Signed)
Pt presents with nasal drainage, congestion, sore throat, and headache since yesterday.

## 2020-11-21 NOTE — Discharge Instructions (Signed)

## 2020-11-21 NOTE — ED Provider Notes (Signed)
[facility]   [id number] 11/21/20 Arrival Time: 1727   CC: COVID symptoms  SUBJECTIVE: History from: patient.  Colton Fowler is a 30 y.o. male who presents with nasal congestion, sore throat, and headache since yesterday . Denies sick exposure to COVID, flu or strep. Denies recent travel. Has history of Covid in December 2021. Has not taken OTC medications for this. There are no aggravating or alleviating factors. Denies previous symptoms in the past. Denies fever, chills, fatigue, sinus pain, rhinorrhea, sore throat, SOB, wheezing, chest pain, nausea, changes in bowel or bladder habits.    ROS: As per HPI.  All other pertinent ROS negative.     Past Medical History:  Diagnosis Date   Asthma    Bronchitis    History reviewed. No pertinent surgical history. Allergies  Allergen Reactions   Shellfish Allergy Swelling    Throat swelling   No current facility-administered medications on file prior to encounter.   Current Outpatient Medications on File Prior to Encounter  Medication Sig Dispense Refill   [DISCONTINUED] fluticasone (FLONASE) 50 MCG/ACT nasal spray Place 1 spray into both nostrils daily. (Patient not taking: Reported on 06/21/2018) 16 g 2   Social History   Socioeconomic History   Marital status: Single    Spouse name: Not on file   Number of children: Not on file   Years of education: Not on file   Highest education level: Not on file  Occupational History   Not on file  Tobacco Use   Smoking status: Never   Smokeless tobacco: Never  Substance and Sexual Activity   Alcohol use: Yes    Comment: once a month   Drug use: Yes    Frequency: 0.5 times per week    Types: Marijuana   Sexual activity: Not on file  Other Topics Concern   Not on file  Social History Narrative   Not on file   Social Determinants of Health   Financial Resource Strain: Not on file  Food Insecurity: Not on file  Transportation Needs: Not on file  Physical Activity:  Not on file  Stress: Not on file  Social Connections: Not on file  Intimate Partner Violence: Not on file   History reviewed. No pertinent family history.  OBJECTIVE:  Vitals:   11/21/20 1816  BP: 137/86  Pulse: 65  Resp: 18  Temp: 98 F (36.7 C)  TempSrc: Oral  SpO2: 100%     General appearance: alert; appears fatigued, but nontoxic; speaking in full sentences and tolerating own secretions HEENT: NCAT; Ears: EACs clear, TMs pearly gray; Eyes: PERRL.  EOM grossly intact. Sinuses: nontender; Nose: nares patent with clear rhinorrhea, Throat: oropharynx not erythematous, tonsils non erythematous or enlarged, uvula midline  Neck: supple without LAD Lungs: unlabored respirations, symmetrical air entry; cough: absent; no respiratory distress; CTAB Heart: regular rate and rhythm.  Radial pulses 2+ symmetrical bilaterally Skin: warm and dry Psychological: alert and cooperative; normal mood and affect  LABS:  No results found for this or any previous visit (from the past 24 hour(s)).   ASSESSMENT & PLAN:  1. Viral illness     No orders of the defined types were placed in this encounter.   Continue supportive care at home COVID and flu testing ordered.  It will take between 2-3 days for test results. Someone will contact you regarding abnormal results.   Work note provided Patient should remain in quarantine until they have received Covid results.  If negative you may resume  normal activities (go back to work/school) while practicing hand hygiene, social distance, and mask wearing.  If positive, patient should remain in quarantine for at least 5 days from symptom onset AND greater than 72 hours after symptoms resolution (absence of fever without the use of fever-reducing medication and improvement in respiratory symptoms), whichever is longer Get plenty of rest and push fluids Use OTC zyrtec for nasal congestion, runny nose, and/or sore throat Use OTC flonase for nasal congestion  and runny nose Use medications daily for symptom relief Use OTC medications like ibuprofen or tylenol as needed fever or pain Call or go to the ED if you have any new or worsening symptoms such as fever, worsening cough, shortness of breath, chest tightness, chest pain, turning blue, changes in mental status.  Reviewed expectations re: course of current medical issues. Questions answered. Outlined signs and symptoms indicating need for more acute intervention. Patient verbalized understanding. After Visit Summary given.          Ivette Loyal, NP 11/21/20 [phone]

## 2020-11-22 LAB — SARS CORONAVIRUS 2 (TAT 6-24 HRS): SARS Coronavirus 2: POSITIVE — AB

## 2021-08-30 ENCOUNTER — Encounter (HOSPITAL_COMMUNITY): Payer: Self-pay | Admitting: Emergency Medicine

## 2021-08-30 ENCOUNTER — Ambulatory Visit (INDEPENDENT_AMBULATORY_CARE_PROVIDER_SITE_OTHER): Payer: BC Managed Care – PPO

## 2021-08-30 ENCOUNTER — Ambulatory Visit (HOSPITAL_COMMUNITY)
Admission: EM | Admit: 2021-08-30 | Discharge: 2021-08-30 | Disposition: A | Discharge status: Home / Self Care | Payer: BC Managed Care – PPO | Attending: Physician Assistant | Admitting: Physician Assistant

## 2021-08-30 DIAGNOSIS — M25561 Pain in right knee: Secondary | ICD-10-CM

## 2021-08-30 MED ORDER — NAPROXEN 500 MG PO TABS: 500.0000 mg | ORAL_TABLET | Freq: Two times a day (BID) | ORAL | 0 refills | Status: DC

## 2021-08-30 NOTE — Discharge Instructions (Signed)
Your x-ray was normal.  Start Naprosyn twice daily.  Do not take NSAIDs including aspirin, ibuprofen/Advil, naproxen/Aleve with this medication.  You can use Tylenol for breakthrough pain.  Keep your knee elevated and use ice and compression for symptom relief.  If your symptoms are not improving please follow-up with orthopedics; call to schedule an appointment.  If anything worsens please return for reevaluation. ?

## 2021-08-30 NOTE — ED Triage Notes (Signed)
Pt is present today with right knee pain. Pt states that the pain is sharp and he noticed it yesterday. Denies any injury  ?

## 2021-08-30 NOTE — ED Provider Notes (Signed)
?[facility] ? ? ? ?CSN: [id number] ?Arrival date & time: 08/30/21  1628 ? ? ?  ? ?History   ?Chief Complaint ?Chief Complaint  ?Patient presents with  ? Knee Pain  ? ? ?HPI ?Colton Fowler is a 31 y.o. male.  ? ?Patient presents today with a 2-day history of right knee pain.  He reports pain is rated "11" on a 0-10 pain scale whenever he tries to bend his knee.  He has tried aspirin and Tylenol without improvement of symptoms.  He denies any known injury or increase in activity prior to symptom onset.  Denies any previous injury or surgery involving his right knee.  Denies any popping, clicking, instability.  He is able to bear weight unassisted but is having difficulty with daily activities as result of symptoms. ? ? ?Past Medical History:  ?Diagnosis Date  ? Asthma   ? Bronchitis   ? ? ?There are no problems to display for this patient. ? ? ?History reviewed. No pertinent surgical history. ? ? ? ? ?Home Medications   ? ?Prior to Admission medications   ?Medication Sig Start Date End Date Taking? Authorizing Provider  ?naproxen (NAPROSYN) 500 MG tablet Take 1 tablet (500 mg total) by mouth 2 (two) times daily. 08/30/21  Yes Andria Head K, PA-C  ?fluticasone (FLONASE) 50 MCG/ACT nasal spray Place 1 spray into both nostrils daily. ?Patient not taking: Reported on 06/21/2018 04/14/18 02/20/19  Zigmund Gottron, NP  ? ? ?Family History ?History reviewed. No pertinent family history. ? ?Social History ?Social History  ? ?Tobacco Use  ? Smoking status: Never  ? Smokeless tobacco: Never  ?Substance Use Topics  ? Alcohol use: Yes  ?  Comment: once a month  ? Drug use: Yes  ?  Frequency: 0.5 times per week  ?  Types: Marijuana  ? ? ? ?Allergies   ?Shellfish allergy ? ? ?Review of Systems ?Review of Systems  ?Constitutional:  Positive for activity change. Negative for appetite change.  ?Musculoskeletal:  Positive for arthralgias, gait problem and joint swelling. Negative for myalgias.  ?Neurological:  Negative for  weakness.  ? ? ?Physical Exam ?Triage Vital Signs ?ED Triage Vitals  ?Enc Vitals Group  ?   BP 08/30/21 1846 (!) 139/92  ?   Pulse Rate 08/30/21 1846 (!) 57  ?   Resp 08/30/21 1846 17  ?   Temp 08/30/21 1846 98.1 ?F (36.7 ?C)  ?   Temp src --   ?   SpO2 08/30/21 1846 99 %  ?   Weight --   ?   Height --   ?   Head Circumference --   ?   Peak Flow --   ?   Pain Score 08/30/21 1845 10  ?   Pain Loc --   ?   Pain Edu? --   ?   Excl. in [place]? --   ? ?No data found. ? ?Updated Vital Signs ?BP 136/81   Pulse (!) 57   Temp 98.1 ?F (36.7 ?C)   Resp 17   SpO2 99%  ? ?Visual Acuity ?Right Eye Distance:   ?Left Eye Distance:   ?Bilateral Distance:   ? ?Right Eye Near:   ?Left Eye Near:    ?Bilateral Near:    ? ?Physical Exam ?Vitals reviewed.  ?Constitutional:   ?   General: He is awake.  ?   Appearance: Normal appearance. He is well-developed. He is not ill-appearing.  ?   Comments: Very  pleasant male appears stated age in no acute distress sitting comfortably in exam room  ?HENT:  ?   Head: Normocephalic and atraumatic.  ?   Mouth/Throat:  ?   Pharynx: No oropharyngeal exudate, posterior oropharyngeal erythema or uvula swelling.  ?Pulmonary:  ?   Effort: Pulmonary effort is normal. No tachypnea or respiratory distress.  ?Musculoskeletal:  ?   Right knee: Bony tenderness present. No swelling or effusion. Decreased range of motion. Tenderness present. No LCL laxity, MCL laxity, ACL laxity or PCL laxity.  ?   Instability Tests: Medial McMurray test negative and lateral McMurray test negative.  ?   Comments: Tenderness palpation over anterior proximal right knee.  No ligamentous laxity on exam.  No deformity or effusion noted.  ?Neurological:  ?   Mental Status: He is alert.  ?Psychiatric:     ?   Behavior: Behavior is cooperative.  ? ? ? ?UC Treatments / Results  ?Labs ?(all labs ordered are listed, but only abnormal results are displayed) ?Labs Reviewed - No data to display ? ?EKG ? ? ?Radiology ?DG Knee Complete 4 Views  Right ? ?Result Date: 08/30/2021 ?CLINICAL DATA:  Knee pain EXAM: RIGHT KNEE - COMPLETE 4+ VIEW COMPARISON:  None. FINDINGS: No evidence of fracture, dislocation, or joint effusion. No evidence of arthropathy or other focal bone abnormality. Soft tissues are unremarkable. IMPRESSION: Negative. Electronically Signed   By: Merilyn Baba M.D.   On: 08/30/2021 19:45   ? ?Procedures ?Procedures (including critical care time) ? ?Medications Ordered in UC ?Medications - No data to display ? ?Initial Impression / Assessment and Plan / UC Course  ?I have reviewed the triage vital signs and the nursing notes. ? ?Pertinent labs & imaging results that were available during my care of the patient were reviewed by me and considered in my medical decision making (see chart for details). ? ?  ? ?X-ray obtained showed no osseous abnormality.  Patient was given hinged knee brace for support and comfort.  Recommended conservative treatment measures including RICE protocol.  He was prescribed Naprosyn to use for pain and inflammation with instruction not to take additional NSAIDs with this medication.  Discussed that if symptoms persist he should follow-up with orthopedics for further evaluation and was given contact information for local provider with instruction to call to schedule an appointment.  Discussed that if he has any worsening symptoms including increased pain, difficulty bearing weight, swelling/numbness/paresthesias of foot he should be seen immediately.  Strict return precautions given.  Work excuse note provided. ? ?Final Clinical Impressions(s) / UC Diagnoses  ? ?Final diagnoses:  ?Acute pain of right knee  ? ? ? ?Discharge Instructions   ? ?  ?Your x-ray was normal.  Start Naprosyn twice daily.  Do not take NSAIDs including aspirin, ibuprofen/Advil, naproxen/Aleve with this medication.  You can use Tylenol for breakthrough pain.  Keep your knee elevated and use ice and compression for symptom relief.  If your symptoms  are not improving please follow-up with orthopedics; call to schedule an appointment.  If anything worsens please return for reevaluation. ? ? ? ? ?ED Prescriptions   ? ? Medication Sig Dispense Auth. Provider  ? naproxen (NAPROSYN) 500 MG tablet Take 1 tablet (500 mg total) by mouth 2 (two) times daily. 30 tablet Yaron Grasse K, PA-C  ? ?  ? ?PDMP not reviewed this encounter. ?  ?Terrilee Croak, PA-C ?08/30/21 1954 ? ?

## 2021-09-06 ENCOUNTER — Ambulatory Visit (INDEPENDENT_AMBULATORY_CARE_PROVIDER_SITE_OTHER): Payer: BC Managed Care – PPO | Admitting: Orthopaedic Surgery

## 2021-09-06 DIAGNOSIS — M222X1 Patellofemoral disorders, right knee: Secondary | ICD-10-CM

## 2021-09-06 DIAGNOSIS — M25561 Pain in right knee: Secondary | ICD-10-CM

## 2021-09-06 NOTE — Progress Notes (Signed)
? ?                            ? ? ?Chief Complaint: right knee pain ?  ? ? ?History of Present Illness:  ? ? ?Colton Fowler is a 31 y.o. male presents with right knee pain and right heel pain that has been significant for the last several weeks.  He did not have any specific injury or accident.  He states that starting 1 week prior he woke up with a dull deep pain behind the knee.  This is worse when he goes from a seated to standing position.  This is worse when he has been sitting in the car for longer period of time.  He is taking naproxen although this does not help.  He does have a history of hip pain and ankle pain.  He has been previously doing Achilles stretches for the right heel but states that these do not work.  He has pain specifically going down the stairs.  He works at [employer]. ? ? ? ?Surgical History:   ?None ? ?PMH/PSH/Family History/Social History/Meds/Allergies:   ? ?Past Medical History:  ?Diagnosis Date  ? Asthma   ? Bronchitis   ? ?No past surgical history on file. ?Social History  ? ?Socioeconomic History  ? Marital status: Single  ?  Spouse name: Not on file  ? Number of children: Not on file  ? Years of education: Not on file  ? Highest education level: Not on file  ?Occupational History  ? Not on file  ?Tobacco Use  ? Smoking status: Never  ? Smokeless tobacco: Never  ?Substance and Sexual Activity  ? Alcohol use: Yes  ?  Comment: once a month  ? Drug use: Yes  ?  Frequency: 0.5 times per week  ?  Types: Marijuana  ? Sexual activity: Not on file  ?Other Topics Concern  ? Not on file  ?Social History Narrative  ? Not on file  ? ?Social Determinants of Health  ? ?Financial Resource Strain: Not on file  ?Food Insecurity: Not on file  ?Transportation Needs: Not on file  ?Physical Activity: Not on file  ?Stress: Not on file  ?Social Connections: Not on file  ? ?No family history on file. ?Allergies  ?Allergen Reactions  ? Shellfish Allergy Swelling  ?  Throat swelling  ? ?Current Outpatient  Medications  ?Medication Sig Dispense Refill  ? naproxen (NAPROSYN) 500 MG tablet Take 1 tablet (500 mg total) by mouth 2 (two) times daily. 30 tablet 0  ? ?No current facility-administered medications for this visit.  ? ?No results found. ? ?Review of Systems:   ?A ROS was performed including pertinent positives and negatives as documented in the HPI. ? ?Physical Exam :   ?Constitutional: NAD and appears stated age ?Neurological: Alert and oriented ?Psych: Appropriate affect and cooperative ?There were no vitals taken for this visit.  ? ?Comprehensive Musculoskeletal Exam:   ? ?  ?Musculoskeletal Exam  ?Gait Normal  ?Alignment Normal  ? Right Left  ?Inspection Normal Normal  ?Palpation    ?Tenderness Patellofemoral None  ?Crepitus None None  ?Effusion None None  ?Range of Motion    ?Extension 0 0  ?Flexion 135 135  ?Strength    ?Extension 5/5 5/5  ?Flexion 5/5 5/5  ?Ligament Exam     ?Generalized Laxity No No  ?Lachman Negative Negative   ?Pivot Shift Negative Negative  ?Anterior Drawer  Negative Negative  ?Valgus at 0 Negative Negative  ?Valgus at 20 Negative Negative  ?Varus at 0 0 0  ?Varus at 20   0 0  ?Posterior Drawer at 90 0 0  ?Vascular/Lymphatic Exam    ?Edema None None  ?Venous Stasis Changes No No  ?Distal Circulation Normal Normal  ?Neurologic    ?Light Touch Sensation Intact Intact  ?Special Tests:   ? ?Tenderness about right Achilles with tight heel cord compared to contralateral side ? ?Imaging:   ?Xray (4 views right knee): ?Normal ? ? ?I personally reviewed and interpreted the radiographs. ? ? ?Assessment:   ?31 y.o. male right patellofemoral knee pain as well as right Achilles tendinitis.  He does have weakness about the right hip with testing.  I do believe this is the root etiology of his patellofemoral pain.  This time I would like to plan for physical therapy for a gait hip and quadricep strengthening home program.  I will see him back in 6 weeks if no improvement ? ?Plan :   ? ?-Return to  clinic as needed ? ? ? ? ?I personally saw and evaluated the patient, and participated in the management and treatment plan. ? ?Huel Cote, MD ?Attending Physician, Orthopedic Surgery ? ?This document was dictated using Conservation officer, historic buildings. A reasonable attempt at proof reading has been made to minimize errors. ?

## 2021-10-03 ENCOUNTER — Ambulatory Visit (HOSPITAL_BASED_OUTPATIENT_CLINIC_OR_DEPARTMENT_OTHER): Payer: BC Managed Care – PPO | Attending: Orthopaedic Surgery | Admitting: Physical Therapy

## 2021-10-31 ENCOUNTER — Ambulatory Visit (HOSPITAL_BASED_OUTPATIENT_CLINIC_OR_DEPARTMENT_OTHER): Payer: BC Managed Care – PPO | Admitting: Physical Therapy

## 2021-10-31 NOTE — Therapy (Incomplete)
OUTPATIENT PHYSICAL THERAPY LOWER EXTREMITY EVALUATION   Patient Name: Colton Fowler MRN: [id number] DOB:[birth year], 31 y.o., male Today's Date: 10/31/2021    Past Medical History:  Diagnosis Date   Asthma    Bronchitis    No past surgical history on file. There are no problems to display for this patient.   PCP: ***  REFERRING PROVIDER: ***  REFERRING DIAG: ***  THERAPY DIAG:  No diagnosis found.  Rationale for Evaluation and Treatment {HABREHAB:27488}  ONSET DATE: ***  SUBJECTIVE:   SUBJECTIVE STATEMENT: ***  PERTINENT HISTORY: ***  PAIN:  Are you having pain? {OPRCPAIN:27236}  PRECAUTIONS: {Therapy precautions:24002}  WEIGHT BEARING RESTRICTIONS {Yes ***/No:24003}  FALLS:  Has patient fallen in last 6 months? {fallsyesno:27318}  LIVING ENVIRONMENT: Lives with: {OPRC lives with:25569::"lives with their family"} Lives in: {Lives in:25570} Stairs: {opstairs:27293} Has following equipment at home: {Assistive devices:23999}  OCCUPATION: ***  PLOF: {PLOF:24004}  PATIENT GOALS ***   OBJECTIVE:   DIAGNOSTIC FINDINGS: ***  PATIENT SURVEYS:  {rehab surveys:24030}  COGNITION:  Overall cognitive status: {cognition:24006}     SENSATION: {sensation:27233}  EDEMA:  {edema:24020}  MUSCLE LENGTH: Hamstrings: Right *** deg; Left *** deg Thomas test: Right *** deg; Left *** deg  POSTURE: {posture:25561}  PALPATION: ***  LOWER EXTREMITY ROM:  {AROM/PROM:27142} ROM Right eval Left eval  Hip flexion    Hip extension    Hip abduction    Hip adduction    Hip internal rotation    Hip external rotation    Knee flexion    Knee extension    Ankle dorsiflexion    Ankle plantarflexion    Ankle inversion    Ankle eversion     (Blank rows = not tested)  LOWER EXTREMITY MMT:  MMT Right eval Left eval  Hip flexion    Hip extension    Hip abduction    Hip adduction    Hip internal rotation    Hip external rotation    Knee flexion     Knee extension    Ankle dorsiflexion    Ankle plantarflexion    Ankle inversion    Ankle eversion     (Blank rows = not tested)  LOWER EXTREMITY SPECIAL TESTS:  {LEspecialtests:26242}  FUNCTIONAL TESTS:  {Functional tests:24029}  GAIT: Distance walked: *** Assistive device utilized: {Assistive devices:23999} Level of assistance: {Levels of assistance:24026} Comments: ***    TODAY'S TREATMENT: ***   PATIENT EDUCATION:  Education details: *** Person educated: {Person educated:25204} Education method: {Education Method:25205} Education comprehension: {Education Comprehension:25206}   HOME EXERCISE PROGRAM: ***  ASSESSMENT:  CLINICAL IMPRESSION: Patient is a *** y.o. *** who was seen today for physical therapy evaluation and treatment for ***.    OBJECTIVE IMPAIRMENTS {opptimpairments:25111}.   ACTIVITY LIMITATIONS {activitylimitations:27494}  PARTICIPATION LIMITATIONS: {participationrestrictions:25113}  PERSONAL FACTORS {Personal factors:25162} are also affecting patient's functional outcome.   REHAB POTENTIAL: {rehabpotential:25112}  CLINICAL DECISION MAKING: {clinical decision making:25114}  EVALUATION COMPLEXITY: {Evaluation complexity:25115}   GOALS: Goals reviewed with patient? {yes/no:20286}  SHORT TERM GOALS: Target date: {follow up:25551}   *** Baseline: Goal status: {GOALSTATUS:25110}  2.  *** Baseline:  Goal status: {GOALSTATUS:25110}  3.  *** Baseline:  Goal status: {GOALSTATUS:25110}  4.  *** Baseline:  Goal status: {GOALSTATUS:25110}  5.  *** Baseline:  Goal status: {GOALSTATUS:25110}  6.  *** Baseline:  Goal status: {GOALSTATUS:25110}  LONG TERM GOALS: Target date: {follow up:25551}   *** Baseline:  Goal status: {GOALSTATUS:25110}  2.  *** Baseline:  Goal status: {GOALSTATUS:25110}  3.  ***  Baseline:  Goal status: {GOALSTATUS:25110}  4.  *** Baseline:  Goal status: {GOALSTATUS:25110}  5.   *** Baseline:  Goal status: {GOALSTATUS:25110}  6.  *** Baseline:  Goal status: {GOALSTATUS:25110}   PLAN: PT FREQUENCY: {rehab frequency:25116}  PT DURATION: {rehab duration:25117}  PLANNED INTERVENTIONS: {rehab planned interventions:25118::"Therapeutic exercises","Therapeutic activity","Neuromuscular re-education","Balance training","Gait training","Patient/Family education","Joint mobilization"}  PLAN FOR NEXT SESSION: ***   Zebedee Iba, PT 10/31/2021, 1:32 PM

## 2022-05-22 ENCOUNTER — Ambulatory Visit (HOSPITAL_COMMUNITY)
Admission: EM | Admit: 2022-05-22 | Discharge: 2022-05-22 | Disposition: A | Discharge status: Home / Self Care | Payer: BC Managed Care – PPO | Attending: Physician Assistant | Admitting: Physician Assistant

## 2022-05-22 ENCOUNTER — Encounter (HOSPITAL_COMMUNITY): Payer: Self-pay | Admitting: Emergency Medicine

## 2022-05-22 DIAGNOSIS — H5712 Ocular pain, left eye: Secondary | ICD-10-CM

## 2022-05-22 MED ORDER — TOBRAMYCIN 0.3 % OP SOLN: 2.0000 [drp] | Freq: Four times a day (QID) | OPHTHALMIC | 0 refills | Status: DC

## 2022-05-22 NOTE — ED Triage Notes (Signed)
Pt reports a sharp pain in his left eye x 3 days. States he was driving one night and noticed his eye started hurting. Certain light makes the eye pain worse. Denies use of contacts and glasses.

## 2022-05-22 NOTE — Discharge Instructions (Signed)
Use eyedrops as per. Recommend Excedrin migraine. Follow-up with ophthalmologist.  Call in the morning.

## 2022-05-22 NOTE — ED Provider Notes (Signed)
[facility]    CSN: [id number] Arrival date & time: 05/22/22  1621      History   Chief Complaint Chief Complaint  Patient presents with   Eye Pain    HPI Colton Fowler is a 31 y.o. male.   Patient complains of left eye pain that started 3 days ago.  He denies injury or trauma.  He denies discharge.  He reports photophobia.  No pain with movement.  Denies contact or glasses.  Denies seeing spots, trouble with vision.  He has tried nothing for the pain.  Denies history of headaches or migraines but does report some mild associated headache.    Past Medical History:  Diagnosis Date   Asthma    Bronchitis     There are no problems to display for this patient.   History reviewed. No pertinent surgical history.     Home Medications    Prior to Admission medications   Medication Sig Start Date End Date Taking? Authorizing Provider  tobramycin (TOBREX) 0.3 % ophthalmic solution Place 2 drops into the left eye every 6 (six) hours. 05/22/22  Yes Ward, Tylene Fantasia, PA-C  naproxen (NAPROSYN) 500 MG tablet Take 1 tablet (500 mg total) by mouth 2 (two) times daily. 08/30/21   Raspet, Erin K, PA-C  fluticasone (FLONASE) 50 MCG/ACT nasal spray Place 1 spray into both nostrils daily. Patient not taking: Reported on 06/21/2018 04/14/18 02/20/19  Georgetta Haber, NP    Family History History reviewed. No pertinent family history.  Social History Social History   Tobacco Use   Smoking status: Never   Smokeless tobacco: Never  Substance Use Topics   Alcohol use: Yes    Comment: once a month   Drug use: Yes    Frequency: 0.5 times per week    Types: Marijuana     Allergies   Shellfish allergy   Review of Systems Review of Systems  Constitutional:  Negative for chills and fever.  HENT:  Negative for ear pain and sore throat.   Eyes:  Positive for pain. Negative for visual disturbance.  Respiratory:  Negative for cough and shortness of breath.    Cardiovascular:  Negative for chest pain and palpitations.  Gastrointestinal:  Negative for abdominal pain and vomiting.  Genitourinary:  Negative for dysuria and hematuria.  Musculoskeletal:  Negative for arthralgias and back pain.  Skin:  Negative for color change and rash.  Neurological:  Negative for seizures and syncope.  All other systems reviewed and are negative.    Physical Exam Triage Vital Signs ED Triage Vitals  Enc Vitals Group     BP 05/22/22 1923 (!) 147/86     Pulse Rate 05/22/22 1923 64     Resp 05/22/22 1923 18     Temp 05/22/22 1923 98.8 F (37.1 C)     Temp Source 05/22/22 1923 Oral     SpO2 05/22/22 1923 94 %     Weight --      Height --      Head Circumference --      Peak Flow --      Pain Score 05/22/22 1922 6     Pain Loc --      Pain Edu? --      Excl. in GC? --    No data found.  Updated Vital Signs BP (!) 147/86 (BP Location: Left Arm)   Pulse 64   Temp 98.8 F (37.1 C) (Oral)   Resp 18  SpO2 94%   Visual Acuity Right Eye Distance:   Left Eye Distance:   Bilateral Distance:    Right Eye Near:   Left Eye Near:    Bilateral Near:     Physical Exam Vitals and nursing note reviewed.  Constitutional:      General: He is not in acute distress.    Appearance: He is well-developed.  HENT:     Head: Normocephalic and atraumatic.  Eyes:     General: Lids are normal. Vision grossly intact.     Extraocular Movements:     Left eye: Normal extraocular motion and no nystagmus.     Conjunctiva/sclera:     Left eye: Left conjunctiva is injected.     Pupils: Pupils are equal, round, and reactive to light.  Cardiovascular:     Rate and Rhythm: Normal rate and regular rhythm.     Heart sounds: No murmur heard. Pulmonary:     Effort: Pulmonary effort is normal. No respiratory distress.     Breath sounds: Normal breath sounds.  Abdominal:     Palpations: Abdomen is soft.     Tenderness: There is no abdominal tenderness.   Musculoskeletal:        General: No swelling.     Cervical back: Neck supple.  Skin:    General: Skin is warm and dry.     Capillary Refill: Capillary refill takes less than 2 seconds.  Neurological:     Mental Status: He is alert.  Psychiatric:        Mood and Affect: Mood normal.      UC Treatments / Results  Labs (all labs ordered are listed, but only abnormal results are displayed) Labs Reviewed - No data to display  EKG   Radiology No results found.  Procedures Procedures (including critical care time)  Medications Ordered in UC Medications - No data to display  Initial Impression / Assessment and Plan / UC Course  I have reviewed the triage vital signs and the nursing notes.  Pertinent labs & imaging results that were available during my care of the patient were reviewed by me and considered in my medical decision making (see chart for details).     Left eye pain, mainly photophobia..  Mild conjunctivitis.  No discharge.  Normal visual acuity, EOM normal, will prescribe antibiotic eyedrops and I recommended Excedrin in case this is migraine aura.  Advised patient to call ophthalmology in the morning.  ED precautions given. Final Clinical Impressions(s) / UC Diagnoses   Final diagnoses:  Left eye pain     Discharge Instructions      Use eyedrops as per. Recommend Excedrin migraine. Follow-up with ophthalmologist.  Call in the morning.   ED Prescriptions     Medication Sig Dispense Auth. Provider   tobramycin (TOBREX) 0.3 % ophthalmic solution Place 2 drops into the left eye every 6 (six) hours. 5 mL Ward, Tylene Fantasia, PA-C      PDMP not reviewed this encounter.   Ward, Tylene Fantasia, PA-C 05/22/22 1943

## 2022-09-18 ENCOUNTER — Emergency Department (HOSPITAL_COMMUNITY)
Admission: EM | Admit: 2022-09-18 | Discharge: 2022-09-18 | Disposition: A | Discharge status: Home / Self Care | Payer: BC Managed Care – PPO | Attending: Emergency Medicine | Admitting: Emergency Medicine

## 2022-09-18 ENCOUNTER — Emergency Department (HOSPITAL_COMMUNITY): Payer: BC Managed Care – PPO

## 2022-09-18 ENCOUNTER — Encounter (HOSPITAL_COMMUNITY): Payer: Self-pay

## 2022-09-18 DIAGNOSIS — J4531 Mild persistent asthma with (acute) exacerbation: Secondary | ICD-10-CM | POA: Diagnosis not present

## 2022-09-18 DIAGNOSIS — R0602 Shortness of breath: Secondary | ICD-10-CM | POA: Diagnosis present

## 2022-09-18 MED ORDER — AEROCHAMBER Z-STAT PLUS/MEDIUM MISC
1.0000 | Freq: Once | Status: AC
  Administered 2022-09-18: 1
  Filled 2022-09-18: qty 1

## 2022-09-18 MED ORDER — IPRATROPIUM BROMIDE HFA 17 MCG/ACT IN AERS
2.0000 | INHALATION_SPRAY | Freq: Once | RESPIRATORY_TRACT | Status: AC
  Administered 2022-09-18: 2 via RESPIRATORY_TRACT
  Filled 2022-09-18: qty 12.9

## 2022-09-18 MED ORDER — ALBUTEROL SULFATE HFA 108 (90 BASE) MCG/ACT IN AERS
6.0000 | INHALATION_SPRAY | Freq: Once | RESPIRATORY_TRACT | Status: AC
  Administered 2022-09-18: 6 via RESPIRATORY_TRACT
  Filled 2022-09-18: qty 6.7

## 2022-09-18 NOTE — ED Triage Notes (Signed)
Pt states that he has been having SOB all night, hx of asthma unrelieved by OTC meds

## 2022-09-18 NOTE — Discharge Instructions (Signed)
Albuterol inhaler: 2-4 puffs every 4-6 hours if needed for wheezing or shortness of breath.  

## 2022-09-18 NOTE — ED Provider Notes (Signed)
[facility] EMERGENCY DEPARTMENT AT [facility] Provider Note  CSN: [id number] Arrival date & time: 09/18/22 0327  Chief Complaint(s) Shortness of Breath  HPI Colton Fowler is a 32 y.o. male with a past medical history listed below including asthma who presents to the emergency department with several hours of gradually worsening shortness of breath and chest tightness.  This is similar to prior asthma exacerbations.  He reports he has not had 1 in several years but over the past 1 to 2 weeks, he has noted his seasonal allergies have kicked up and have not been controlled with over-the-counter medicine. He reports nasal congestion. He denies any recent fevers or infections.  No coughing or congestion.  No nausea or vomiting.  No other physical complaints  The history is provided by the patient.    Past Medical History Past Medical History:  Diagnosis Date   Asthma    Bronchitis    There are no problems to display for this patient.  Home Medication(s) Prior to Admission medications   Medication Sig Start Date End Date Taking? Authorizing Provider  naproxen (NAPROSYN) 500 MG tablet Take 1 tablet (500 mg total) by mouth 2 (two) times daily. 08/30/21   Raspet, Denny Peon K, PA-C  tobramycin (TOBREX) 0.3 % ophthalmic solution Place 2 drops into the left eye every 6 (six) hours. 05/22/22   Ward, Tylene Fantasia, PA-C  fluticasone (FLONASE) 50 MCG/ACT nasal spray Place 1 spray into both nostrils daily. Patient not taking: Reported on 06/21/2018 04/14/18 02/20/19  Georgetta Haber, NP                                                                                                                                    Allergies Shellfish allergy  Review of Systems Review of Systems As noted in HPI  Physical Exam Vital Signs  I have reviewed the triage vital signs BP (!) 144/89 (BP Location: Right Arm)   Pulse 96   Temp 97.6 F (36.4 C) (Oral)   Resp 20   SpO2 98%   Physical Exam Vitals  reviewed.  Constitutional:      General: He is not in acute distress.    Appearance: He is well-developed. He is not diaphoretic.  HENT:     Head: Normocephalic and atraumatic.     Nose: Congestion present.  Eyes:     General: No scleral icterus.       Right eye: No discharge.        Left eye: No discharge.     Conjunctiva/sclera: Conjunctivae normal.     Pupils: Pupils are equal, round, and reactive to light.  Cardiovascular:     Rate and Rhythm: Normal rate and regular rhythm.     Heart sounds: No murmur heard.    No friction rub. No gallop.  Pulmonary:     Effort: Pulmonary effort is normal. Tachypnea present. No respiratory distress.  Breath sounds: Normal breath sounds. Decreased air movement present. No stridor. No rales.  Abdominal:     General: There is no distension.     Palpations: Abdomen is soft.     Tenderness: There is no abdominal tenderness.  Musculoskeletal:        General: No tenderness.     Cervical back: Normal range of motion and neck supple.  Skin:    General: Skin is warm and dry.     Findings: No erythema or rash.  Neurological:     Mental Status: He is alert and oriented to person, place, and time.     ED Results and Treatments Labs (all labs ordered are listed, but only abnormal results are displayed) Labs Reviewed - No data to display                                                                                                                       EKG  EKG Interpretation  Date/Time:  Tuesday September 18 2022 03:38:29 EDT Ventricular Rate:  105 PR Interval:  148 QRS Duration: 90 QT Interval:  315 QTC Calculation: 417 R Axis:   89 Text Interpretation: Sinus tachycardia Right atrial enlargement Borderline T wave abnormalities Confirmed by Drema Pry [id number]) on 09/18/2022 5:03:37 AM       Radiology DG Chest 2 View  Result Date: 09/18/2022 CLINICAL DATA:  Shortness of breath EXAM: CHEST - 2 VIEW COMPARISON:  04/26/2020 FINDINGS:  Lungs are clear.  No pleural effusion or pneumothorax. The heart is normal in size. Visualized osseous structures are within normal limits. IMPRESSION: Normal chest radiographs. Electronically Signed   By: Charline Bills M.D.   On: 09/18/2022 03:49    Medications Ordered in ED Medications  albuterol (VENTOLIN HFA) 108 (90 Base) MCG/ACT inhaler 6 puff (6 puffs Inhalation Given 09/18/22 0456)  ipratropium (ATROVENT HFA) inhaler 2 puff (2 puffs Inhalation Given 09/18/22 0456)  aerochamber Z-Stat Plus/medium 1 each (1 each Other Given 09/18/22 0457)                                                                                                                                     Procedures Procedures  (including critical care time)  Medical Decision Making / ED Course  Click here for ABCD2, HEART and other calculators  Medical Decision Making Amount and/or Complexity of Data Reviewed Radiology: ordered and independent interpretation performed. Decision-making details  documented in ED Course. ECG/medicine tests: ordered and independent interpretation performed. Decision-making details documented in ED Course.  Risk Prescription drug management.    This patient presents to the ED for concern of SOB, this involves an extensive number of treatment options, and is a complaint that carries with it a high risk of complications and morbidity. The differential diagnosis includes but not limited to asthma exacerbation. Will rule out PNA, PTX. Doubt PE.  Initial intervention:  Albuterol and atrovent puffs through spacer  Work up: [employer] and/or imaging independently interpreted by me) X-ray without evidence of pneumonia, pneumothorax, pulmonary edema or pleural effusions EKG with sinus tachycardia.  No acute ischemic changes or evidence of pericarditis.  Reassessment: Improved air movement and wheezing. No need for steroids       Final Clinical Impression(s) / ED Diagnoses Final diagnoses:   Mild persistent asthma with exacerbation   The patient appears reasonably screened and/or stabilized for discharge and I doubt any other medical condition or other [facility] requiring further screening, evaluation, or treatment in the ED at this time. I have discussed the findings, Dx and Tx plan with the patient/family who expressed understanding and agree(s) with the plan. Discharge instructions discussed at length. The patient/family was given strict return precautions who verbalized understanding of the instructions. No further questions at time of discharge.  Disposition: Discharge  Condition: Good  ED Discharge Orders     None         Follow Up: Felix Pacini, FNP [address] [place] [address] [phone]  Call  to schedule an appointment for close follow up           This chart was dictated using voice recognition software.  Despite best efforts to proofread,  errors can occur which can change the documentation meaning.    Nira Conn, MD 09/18/22 [phone]

## 2022-12-05 ENCOUNTER — Other Ambulatory Visit (HOSPITAL_BASED_OUTPATIENT_CLINIC_OR_DEPARTMENT_OTHER): Payer: Self-pay

## 2022-12-05 MED ORDER — WEGOVY 0.25 MG/0.5ML ~~LOC~~ SOAJ
0.2500 mg | SUBCUTANEOUS | 0 refills | Status: DC
  Filled 2022-12-05 – 2022-12-25 (×2): qty 2, 28d supply, fill #0

## 2022-12-07 ENCOUNTER — Other Ambulatory Visit (HOSPITAL_BASED_OUTPATIENT_CLINIC_OR_DEPARTMENT_OTHER): Payer: Self-pay

## 2022-12-17 ENCOUNTER — Other Ambulatory Visit (HOSPITAL_BASED_OUTPATIENT_CLINIC_OR_DEPARTMENT_OTHER): Payer: Self-pay

## 2022-12-25 ENCOUNTER — Other Ambulatory Visit (HOSPITAL_BASED_OUTPATIENT_CLINIC_OR_DEPARTMENT_OTHER): Payer: Self-pay

## 2023-04-29 ENCOUNTER — Encounter (HOSPITAL_COMMUNITY): Payer: Self-pay | Admitting: Emergency Medicine

## 2023-04-29 ENCOUNTER — Ambulatory Visit (HOSPITAL_COMMUNITY)
Admission: EM | Admit: 2023-04-29 | Discharge: 2023-04-29 | Disposition: A | Discharge status: Home / Self Care | Payer: BC Managed Care – PPO | Attending: Family Medicine | Admitting: Family Medicine

## 2023-04-29 DIAGNOSIS — J4521 Mild intermittent asthma with (acute) exacerbation: Secondary | ICD-10-CM | POA: Diagnosis not present

## 2023-04-29 MED ORDER — ALBUTEROL SULFATE HFA 108 (90 BASE) MCG/ACT IN AERS: 2.0000 | INHALATION_SPRAY | RESPIRATORY_TRACT | 0 refills | Status: DC | PRN

## 2023-04-29 MED ORDER — PREDNISONE 20 MG PO TABS: 40.0000 mg | ORAL_TABLET | Freq: Every day | ORAL | 0 refills | Status: AC

## 2023-04-29 MED ORDER — ALBUTEROL SULFATE (2.5 MG/3ML) 0.083% IN NEBU
2.5000 mg | INHALATION_SOLUTION | Freq: Once | RESPIRATORY_TRACT | Status: AC
  Administered 2023-04-29: 2.5 mg via RESPIRATORY_TRACT

## 2023-04-29 MED ORDER — ALBUTEROL SULFATE (2.5 MG/3ML) 0.083% IN NEBU
INHALATION_SOLUTION | RESPIRATORY_TRACT | Status: AC
  Filled 2023-04-29: qty 3

## 2023-04-29 NOTE — ED Provider Notes (Signed)
[facility]    CSN: [id number] Arrival date & time: 04/29/23  1434      History   Chief Complaint Chief Complaint  Patient presents with   Shortness of Breath    HPI Colton Fowler is a 32 y.o. male.    Shortness of Breath Here for shortness of breath and wheezing that started last night.  For the first few hours it bothered him a lot and made it hard for him to rest.  It is some better now but he is still wheezing and feeling tight in his chest.  No cough or congestion or fever.    He is not allergic to any medications    Past Medical History:  Diagnosis Date   Asthma    Bronchitis     There are no problems to display for this patient.   History reviewed. No pertinent surgical history.     Home Medications    Prior to Admission medications   Medication Sig Start Date End Date Taking? Authorizing Provider  albuterol (VENTOLIN HFA) 108 (90 Base) MCG/ACT inhaler Inhale 2 puffs into the lungs every 4 (four) hours as needed for wheezing or shortness of breath. 04/29/23  Yes Zenia Resides, MD  predniSONE (DELTASONE) 20 MG tablet Take 2 tablets (40 mg total) by mouth daily with breakfast for 5 days. 04/29/23 05/04/23 Yes Zenia Resides, MD  fluticasone (FLONASE) 50 MCG/ACT nasal spray Place 1 spray into both nostrils daily. Patient not taking: Reported on 06/21/2018 04/14/18 02/20/19  Georgetta Haber, NP    Family History History reviewed. No pertinent family history.  Social History Social History   Tobacco Use   Smoking status: Never   Smokeless tobacco: Never  Substance Use Topics   Alcohol use: Yes    Comment: once a month   Drug use: Yes    Frequency: 0.5 times per week    Types: Marijuana     Allergies   Shellfish allergy   Review of Systems Review of Systems  Respiratory:  Positive for shortness of breath.      Physical Exam Triage Vital Signs ED Triage Vitals  Encounter Vitals Group     BP 04/29/23 1646 (!)  141/92     Systolic BP Percentile --      Diastolic BP Percentile --      Pulse Rate 04/29/23 1646 67     Resp 04/29/23 1646 17     Temp 04/29/23 1646 98.3 F (36.8 C)     Temp Source 04/29/23 1645 Oral     SpO2 04/29/23 1646 96 %     Weight --      Height --      Head Circumference --      Peak Flow --      Pain Score 04/29/23 1649 4     Pain Loc --      Pain Education --      Exclude from Growth Chart --    No data found.  Updated Vital Signs BP (!) 141/92 (BP Location: Right Arm)   Pulse 67   Temp 98.3 F (36.8 C) (Oral)   Resp 17   SpO2 96%   Visual Acuity Right Eye Distance:   Left Eye Distance:   Bilateral Distance:    Right Eye Near:   Left Eye Near:    Bilateral Near:     Physical Exam Vitals reviewed.  Constitutional:      General: He is not in acute  distress.    Appearance: He is not ill-appearing, toxic-appearing or diaphoretic.  HENT:     Mouth/Throat:     Mouth: Mucous membranes are moist.  Eyes:     Extraocular Movements: Extraocular movements intact.     Conjunctiva/sclera: Conjunctivae normal.     Pupils: Pupils are equal, round, and reactive to light.  Cardiovascular:     Rate and Rhythm: Normal rate and regular rhythm.     Heart sounds: No murmur heard. Pulmonary:     Comments: He is working hard to breathe at the time of exam with some expiratory wheezes heard.  Expiratory phase is prolonged. Musculoskeletal:     Cervical back: Neck supple.  Lymphadenopathy:     Cervical: No cervical adenopathy.  Skin:    Coloration: Skin is not jaundiced or pale.  Neurological:     General: No focal deficit present.     Mental Status: He is alert and oriented to person, place, and time.  Psychiatric:        Behavior: Behavior normal.      UC Treatments / Results  Labs (all labs ordered are listed, but only abnormal results are displayed) Labs Reviewed - No data to display  EKG   Radiology No results found.  Procedures Procedures  (including critical care time)  Medications Ordered in UC Medications  albuterol (PROVENTIL) (2.5 MG/3ML) 0.083% nebulizer solution 2.5 mg (2.5 mg Nebulization Given 04/29/23 1713)    Initial Impression / Assessment and Plan / UC Course  I have reviewed the triage vital signs and the nursing notes.  Pertinent labs & imaging results that were available during my care of the patient were reviewed by me and considered in my medical decision making (see chart for details).     After the albuterol neb tx, he feels better, and he is not wheezing on exam, and is moving air much better.  Albuterol inhaler is sent in along with 5 day course of prednisone for asthma exacerbation. Final Clinical Impressions(s) / UC Diagnoses   Final diagnoses:  Mild intermittent asthma with exacerbation     Discharge Instructions      Albuterol inhaler--do 2 puffs every 4 hours as needed for shortness of breath or wheezing(we gave you 1 dose of this medicine in the nebulizer breathing treatment here at the clinic)  Take prednisone 20 mg--2 daily for 5 days       ED Prescriptions     Medication Sig Dispense Auth. Provider   albuterol (VENTOLIN HFA) 108 (90 Base) MCG/ACT inhaler Inhale 2 puffs into the lungs every 4 (four) hours as needed for wheezing or shortness of breath. 1 each Zenia Resides, MD   predniSONE (DELTASONE) 20 MG tablet Take 2 tablets (40 mg total) by mouth daily with breakfast for 5 days. 10 tablet Marlinda Mike Janace Aris, MD      PDMP not reviewed this encounter.   Zenia Resides, MD 04/29/23 [phone]

## 2023-04-29 NOTE — ED Triage Notes (Signed)
Pt c/o SOB, chest tightness and back pain that started last night.  Pt has some wheezing. Denies any other respiratory symptoms

## 2023-04-29 NOTE — Discharge Instructions (Addendum)
Albuterol inhaler--do 2 puffs every 4 hours as needed for shortness of breath or wheezing(we gave you 1 dose of this medicine in the nebulizer breathing treatment here at the clinic)  Take prednisone 20 mg--2 daily for 5 days

## 2023-06-17 ENCOUNTER — Ambulatory Visit
Admission: EM | Admit: 2023-06-17 | Discharge: 2023-06-17 | Disposition: A | Discharge status: Home / Self Care | Payer: BC Managed Care – PPO | Attending: Family Medicine | Admitting: Family Medicine

## 2023-06-17 DIAGNOSIS — J4530 Mild persistent asthma, uncomplicated: Secondary | ICD-10-CM | POA: Diagnosis not present

## 2023-06-17 MED ORDER — PROMETHAZINE-DM 6.25-15 MG/5ML PO SYRP: 5.0000 mL | ORAL_SOLUTION | Freq: Three times a day (TID) | ORAL | 0 refills | Status: DC | PRN

## 2023-06-17 MED ORDER — PREDNISONE 20 MG PO TABS: ORAL_TABLET | ORAL | 0 refills | Status: DC

## 2023-06-17 MED ORDER — ALBUTEROL SULFATE HFA 108 (90 BASE) MCG/ACT IN AERS: 2.0000 | INHALATION_SPRAY | RESPIRATORY_TRACT | 0 refills | Status: DC | PRN

## 2023-06-17 NOTE — ED Provider Notes (Signed)
 [facility]  Note:  This document was prepared using Conservation officer, historic buildings and may include unintentional dictation errors.  MRN: [id number] DOB: 29-Jul-1990  Subjective:   Colton Fowler is a 33 y.o. male presenting for 7 to 9-day history of persistent coughing, shortness of breath, chest tightness and wheezing.  No fever, body pains, sick symptoms.  Does not have his inhaler anymore.  No smoking of any kind including cigarettes, cigars, vaping, marijuana use.  He was smoking regularly but quit a few months ago.  No current facility-administered medications for this encounter.  Current Outpatient Medications:    albuterol  (VENTOLIN  HFA) 108 (90 Base) MCG/ACT inhaler, Inhale 2 puffs into the lungs every 4 (four) hours as needed for wheezing or shortness of breath., Disp: 1 each, Rfl: 0   Allergies  Allergen Reactions   Shellfish Allergy Swelling    Throat swelling    Past Medical History:  Diagnosis Date   Asthma    Bronchitis      History reviewed. No pertinent surgical history.  No family history on file.  Social History   Tobacco Use   Smoking status: Never   Smokeless tobacco: Never  Vaping Use   Vaping status: Never Used  Substance Use Topics   Alcohol use: Yes    Comment: occ   Drug use: Not Currently    Frequency: 0.5 times per week    Types: Marijuana    ROS   Objective:   Vitals: BP 131/82   Pulse 90   Temp (!) 86 F (30 C) (Oral)   Resp 20   SpO2 95%   Physical Exam Constitutional:      General: He is not in acute distress.    Appearance: Normal appearance. He is well-developed and normal weight. He is not ill-appearing, toxic-appearing or diaphoretic.  HENT:     Head: Normocephalic and atraumatic.     Right Ear: Tympanic membrane, ear canal and external ear normal. No drainage, swelling or tenderness. No middle ear effusion. There is no impacted cerumen. Tympanic membrane is not erythematous or bulging.      Left Ear: Tympanic membrane, ear canal and external ear normal. No drainage, swelling or tenderness.  No middle ear effusion. There is no impacted cerumen. Tympanic membrane is not erythematous or bulging.     Nose: Nose normal. No congestion or rhinorrhea.     Mouth/Throat:     Mouth: Mucous membranes are moist.     Pharynx: No oropharyngeal exudate or posterior oropharyngeal erythema.  Eyes:     General: No scleral icterus.       Right eye: No discharge.        Left eye: No discharge.     Extraocular Movements: Extraocular movements intact.     Conjunctiva/sclera: Conjunctivae normal.  Cardiovascular:     Rate and Rhythm: Normal rate and regular rhythm.     Heart sounds: Normal heart sounds. No murmur heard.    No friction rub. No gallop.  Pulmonary:     Effort: Pulmonary effort is normal. No respiratory distress.     Breath sounds: No stridor. Wheezing (trace over mid to lower lung fields bilaterally) present. No rhonchi or rales.  Musculoskeletal:     Cervical back: Normal range of motion and neck supple. No rigidity. No muscular tenderness.  Neurological:     General: No focal deficit present.     Mental Status: He is alert and oriented to person, place, and time.  Psychiatric:        Mood and Affect: Mood normal.        Behavior: Behavior normal.        Thought Content: Thought content normal.     Assessment and Plan :   PDMP not reviewed this encounter.  1. Mild persistent asthmatic bronchitis without complication    Low suspicion for an infectious process, will manage for persistent asthmatic bronchitis with oral prednisone  course, supportive care.  Refilled his albuterol  inhaler.  Patient was agreeable to defer imaging today given lack of adventitious lung sounds and sick symptoms.  Counseled patient on potential for adverse effects with medications prescribed/recommended today, ER and return-to-clinic precautions discussed, patient verbalized understanding.    Colton Fowler, [place] 06/17/23 [phone]

## 2023-06-17 NOTE — ED Triage Notes (Signed)
 Pt c/o prod cough x 1 week and SHOB started yesterday-ran out of albuterol inhaler last week-NAD-steady gait

## 2023-07-30 ENCOUNTER — Ambulatory Visit (HOSPITAL_BASED_OUTPATIENT_CLINIC_OR_DEPARTMENT_OTHER)
Admission: RE | Admit: 2023-07-30 | Discharge: 2023-07-30 | Disposition: A | Discharge status: Home / Self Care | Payer: BC Managed Care – PPO | Source: Ambulatory Visit | Attending: Urgent Care

## 2023-07-30 ENCOUNTER — Ambulatory Visit
Admission: RE | Admit: 2023-07-30 | Discharge: 2023-07-30 | Disposition: A | Discharge status: Home / Self Care | Payer: Self-pay | Source: Ambulatory Visit | Attending: Family Medicine | Admitting: Family Medicine

## 2023-07-30 VITALS — BP 139/73 | HR 70 | Temp 98.0°F | Resp 20

## 2023-07-30 DIAGNOSIS — R079 Chest pain, unspecified: Secondary | ICD-10-CM | POA: Diagnosis not present

## 2023-07-30 DIAGNOSIS — M25511 Pain in right shoulder: Secondary | ICD-10-CM | POA: Insufficient documentation

## 2023-07-30 DIAGNOSIS — J4530 Mild persistent asthma, uncomplicated: Secondary | ICD-10-CM | POA: Insufficient documentation

## 2023-07-30 MED ORDER — ALBUTEROL SULFATE HFA 108 (90 BASE) MCG/ACT IN AERS: 2.0000 | INHALATION_SPRAY | RESPIRATORY_TRACT | 0 refills | Status: DC | PRN

## 2023-07-30 MED ORDER — CYCLOBENZAPRINE HCL 5 MG PO TABS: 5.0000 mg | ORAL_TABLET | Freq: Every evening | ORAL | 0 refills | Status: AC | PRN

## 2023-07-30 MED ORDER — NAPROXEN 500 MG PO TABS: 500.0000 mg | ORAL_TABLET | Freq: Two times a day (BID) | ORAL | 0 refills | Status: AC

## 2023-07-30 NOTE — Discharge Instructions (Signed)
 I have placed orders to have an x-ray done at the med center in [facility].  Please had there now.  Go through the main hospital and not the emergency room.  Once you are there and let them know that you will came to our clinic and we send she to their facility for an outpatient x-ray.  If no one is at the front desk then they are likely out the rest of the day and at that point you would have to go through the emergency room.  Do not check in as a patient through the emergency room.  Simply let them know that you are there for an outpatient x-ray from our clinic.  I will call you with your results and update our treatment plan if necessary after I get the report.

## 2023-07-30 NOTE — ED Provider Notes (Addendum)
 [name]  Note:  This document was prepared using Conservation officer, historic buildings and may include unintentional dictation errors.  MRN: [id number] DOB: 01/08/1991  Subjective:   Colton Fowler is a 33 y.o. male presenting for 1 day history of persistent right shoulder pain, pain along the trapezius and the right side of his upper lung when he takes a deep breath.  Has a history of asthma.  No fall, trauma to the shoulder or chest.  No cough, shortness of breath or wheezing.  He is working on quitting smoking.  Was smoked marijuana regularly but has not in 3 weeks.  Needs a refill on his albuterol inhaler.  No current facility-administered medications for this encounter.  Current Outpatient Medications:    albuterol (VENTOLIN HFA) 108 (90 Base) MCG/ACT inhaler, Inhale 2 puffs into the lungs every 4 (four) hours as needed for wheezing or shortness of breath., Disp: 18 g, Rfl: 0   predniSONE (DELTASONE) 20 MG tablet, Take 2 tablets daily with breakfast., Disp: 10 tablet, Rfl: 0   promethazine-dextromethorphan (PROMETHAZINE-DM) 6.25-15 MG/5ML syrup, Take 5 mLs by mouth 3 (three) times daily as needed for cough., Disp: 200 mL, Rfl: 0   Allergies  Allergen Reactions   Shellfish Allergy Swelling    Throat swelling    Past Medical History:  Diagnosis Date   Asthma    Bronchitis      History reviewed. No pertinent surgical history.  No family history on file.  Social History   Tobacco Use   Smoking status: Never   Smokeless tobacco: Never  Vaping Use   Vaping status: Never Used  Substance Use Topics   Alcohol use: Yes    Comment: occ   Drug use: Yes    Types: Marijuana    ROS   Objective:   Vitals: BP 139/73 (BP Location: Right Arm)   Pulse 70   Temp 98 F (36.7 C) (Oral)   Resp 20   SpO2 94%   Physical Exam Constitutional:      General: He is not in acute distress.    Appearance: Normal appearance. He is well-developed. He is not  ill-appearing, toxic-appearing or diaphoretic.  HENT:     Head: Normocephalic and atraumatic.     Right Ear: External ear normal.     Left Ear: External ear normal.     Nose: Nose normal.     Mouth/Throat:     Mouth: Mucous membranes are moist.  Eyes:     General: No scleral icterus.       Right eye: No discharge.        Left eye: No discharge.     Extraocular Movements: Extraocular movements intact.  Cardiovascular:     Rate and Rhythm: Normal rate and regular rhythm.     Heart sounds: Normal heart sounds. No murmur heard.    No friction rub. No gallop.  Pulmonary:     Effort: Pulmonary effort is normal. No respiratory distress.     Breath sounds: No stridor. Examination of the right-upper field reveals wheezing. Wheezing present. No rhonchi or rales.  Musculoskeletal:     Right shoulder: Tenderness (across areas outlined) present. No swelling, deformity, effusion, laceration, bony tenderness or crepitus. Normal range of motion. Normal strength.       Arms:  Neurological:     Mental Status: He is alert and oriented to person, place, and time.  Psychiatric:        Mood and Affect: Mood normal.  Behavior: Behavior normal.        Thought Content: Thought content normal.     Assessment and Plan :   PDMP not reviewed this encounter.  1. Acute pain of right shoulder   2. Right-sided chest pain   3. Mild persistent asthma without complication    Refilled his albuterol inhaler for the wheezing on exam.  Recommend pursuing imaging.  Will follow-up once I receive the radiology over read.  Will make final diagnosis and treatment plan with those results.  Counseled patient on potential for adverse effects with medications prescribed/recommended today, ER and return-to-clinic precautions discussed, patient verbalized understanding.    Wallis Bamberg, [place] 07/30/23 1144  DG Chest 2 View Result Date: 07/30/2023 CLINICAL DATA:  Right shoulder/upper chest pain. EXAM: CHEST - 2  VIEW; RIGHT SHOULDER - 2+ VIEW COMPARISON:  Chest radiograph dated 09/18/2022. FINDINGS: The lungs are clear. There is no pleural effusion or pneumothorax. The cardiac silhouette is within limits. No acute osseous pathology. No acute fracture or dislocation of the right shoulder. The bones are well mineralized. No arthritic changes. The soft tissues are unremarkable. IMPRESSION: 1. No active cardiopulmonary disease. 2. No acute fracture or dislocation of the right shoulder. Electronically Signed   By: Elgie Collard M.D.   On: 07/30/2023 11:58   DG Shoulder Right Result Date: 07/30/2023 CLINICAL DATA:  Right shoulder/upper chest pain. EXAM: CHEST - 2 VIEW; RIGHT SHOULDER - 2+ VIEW COMPARISON:  Chest radiograph dated 09/18/2022. FINDINGS: The lungs are clear. There is no pleural effusion or pneumothorax. The cardiac silhouette is within limits. No acute osseous pathology. No acute fracture or dislocation of the right shoulder. The bones are well mineralized. No arthritic changes. The soft tissues are unremarkable. IMPRESSION: 1. No active cardiopulmonary disease. 2. No acute fracture or dislocation of the right shoulder. Electronically Signed   By: Elgie Collard M.D.   On: 07/30/2023 11:58   I will be adding naproxen to his regimen to help with pain and inflammation, musculoskeletal pain.   Wallis Bamberg, [place] 07/30/23 1216

## 2023-07-30 NOTE — ED Triage Notes (Signed)
 Pt c/o pain to right shoulder and pain to "my lung when I take a deep breath" sx started 2/22-denies shoulder injury-states he had n/v/d x 1 day on 2/21-NAD-steady gait

## 2023-08-15 IMAGING — DX DG KNEE COMPLETE 4+V*R*
4 series · 4 of 4 positions shown · non-contrast
Comparison: None.

CLINICAL DATA: Knee pain

EXAM:
RIGHT KNEE - COMPLETE 4+ VIEW

[knee ap]
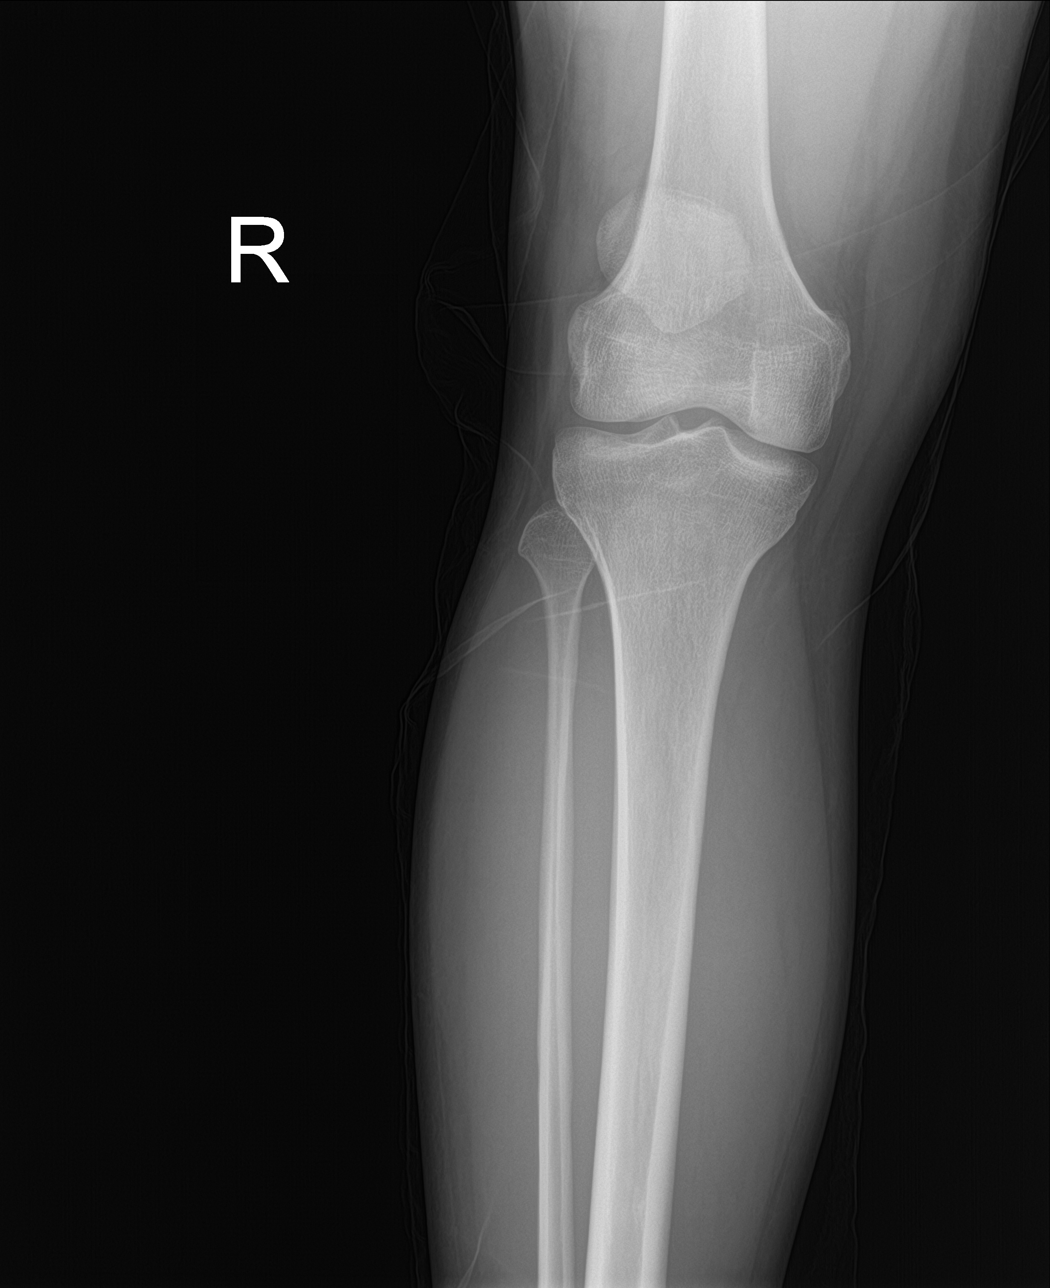

[knee obl (1 of 2)]
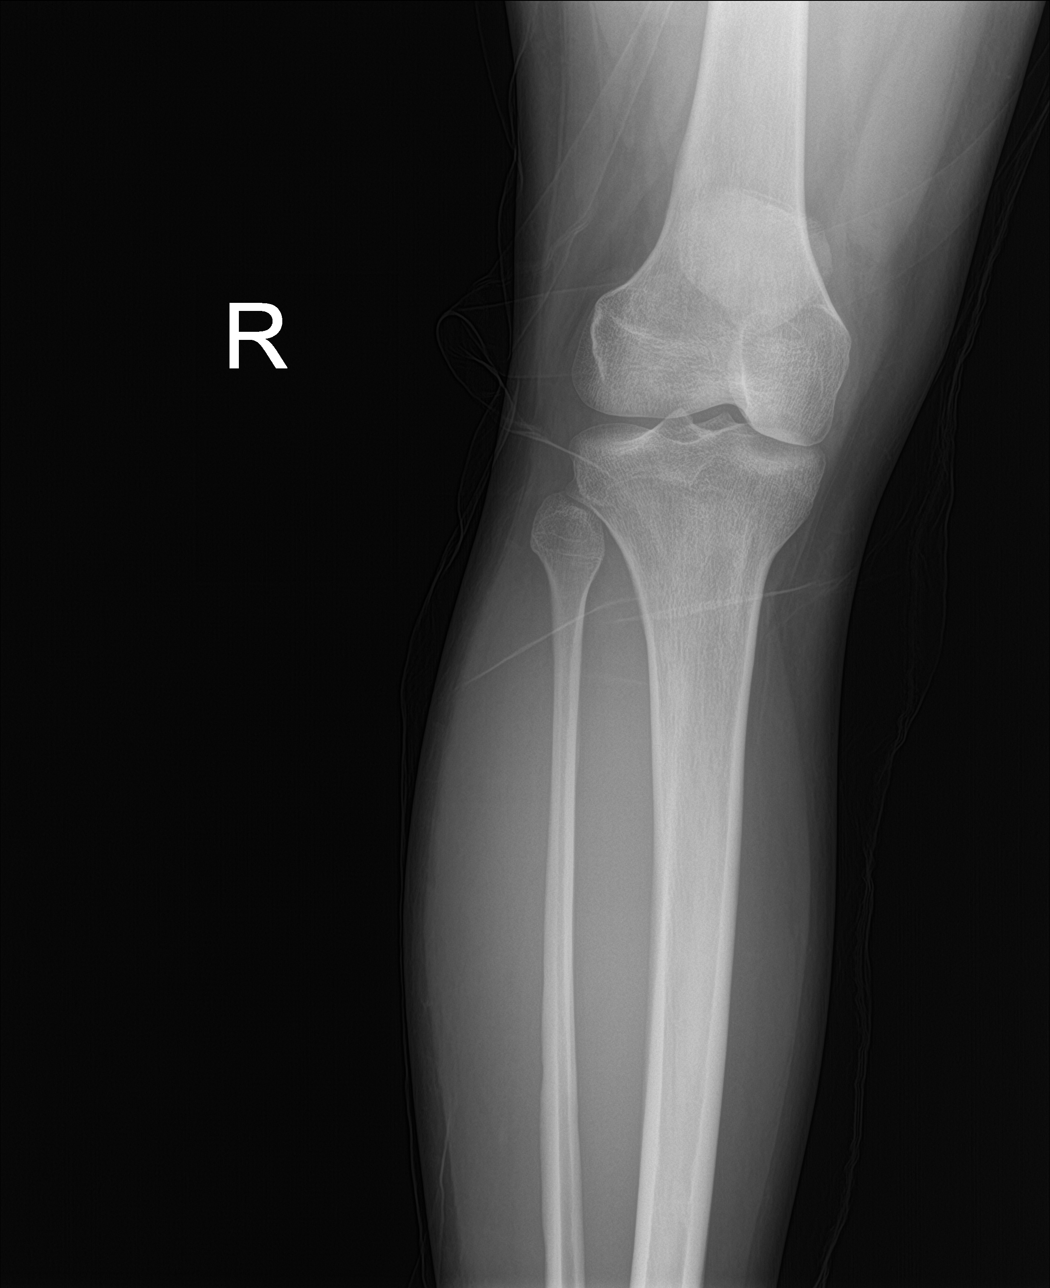

[knee obl (2 of 2)]
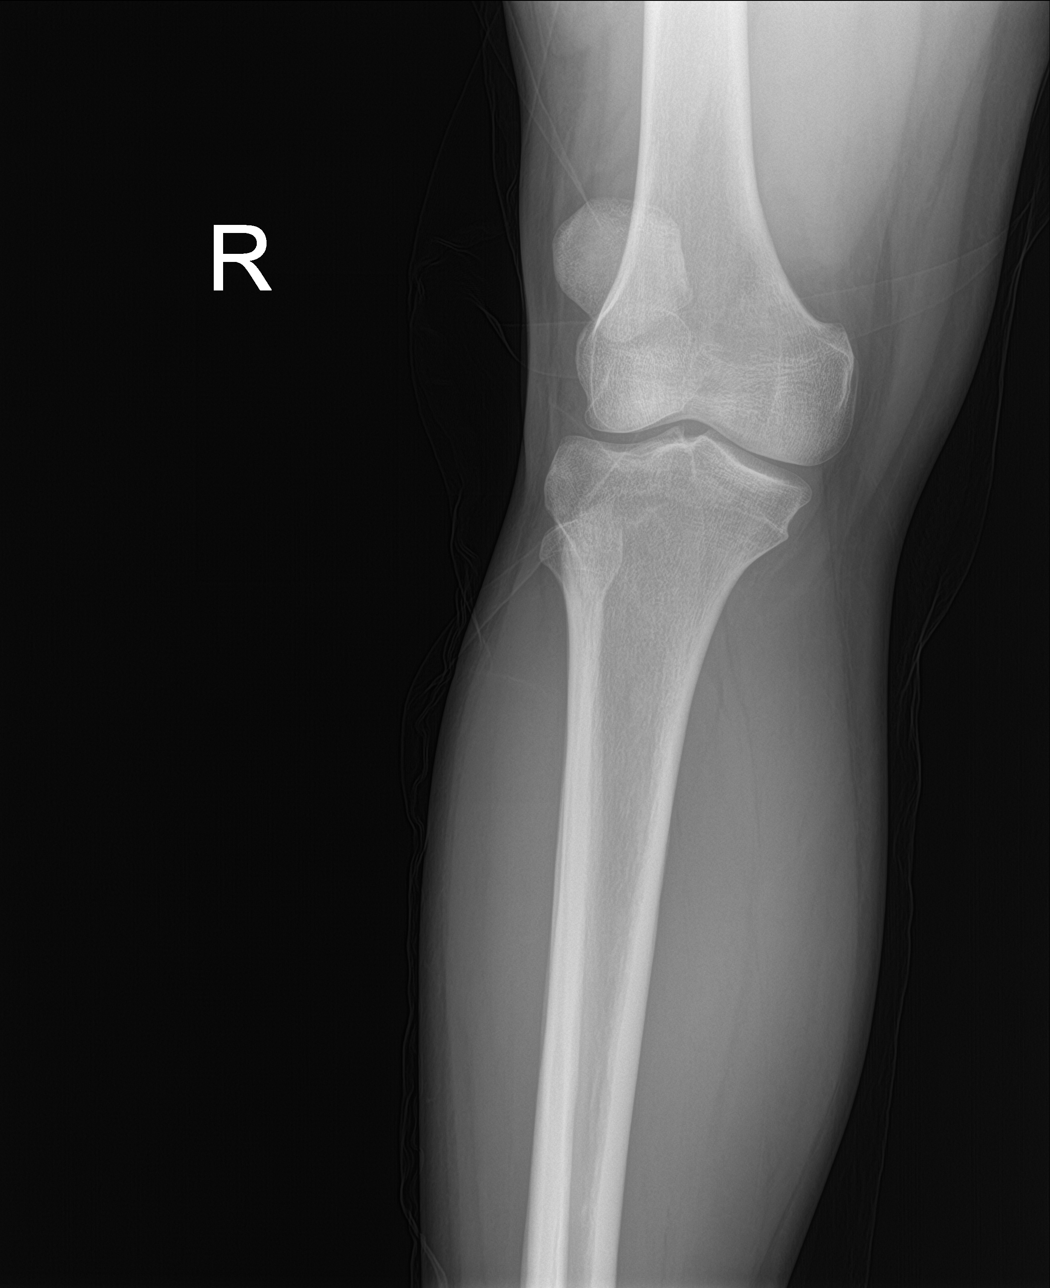

[knee lat]
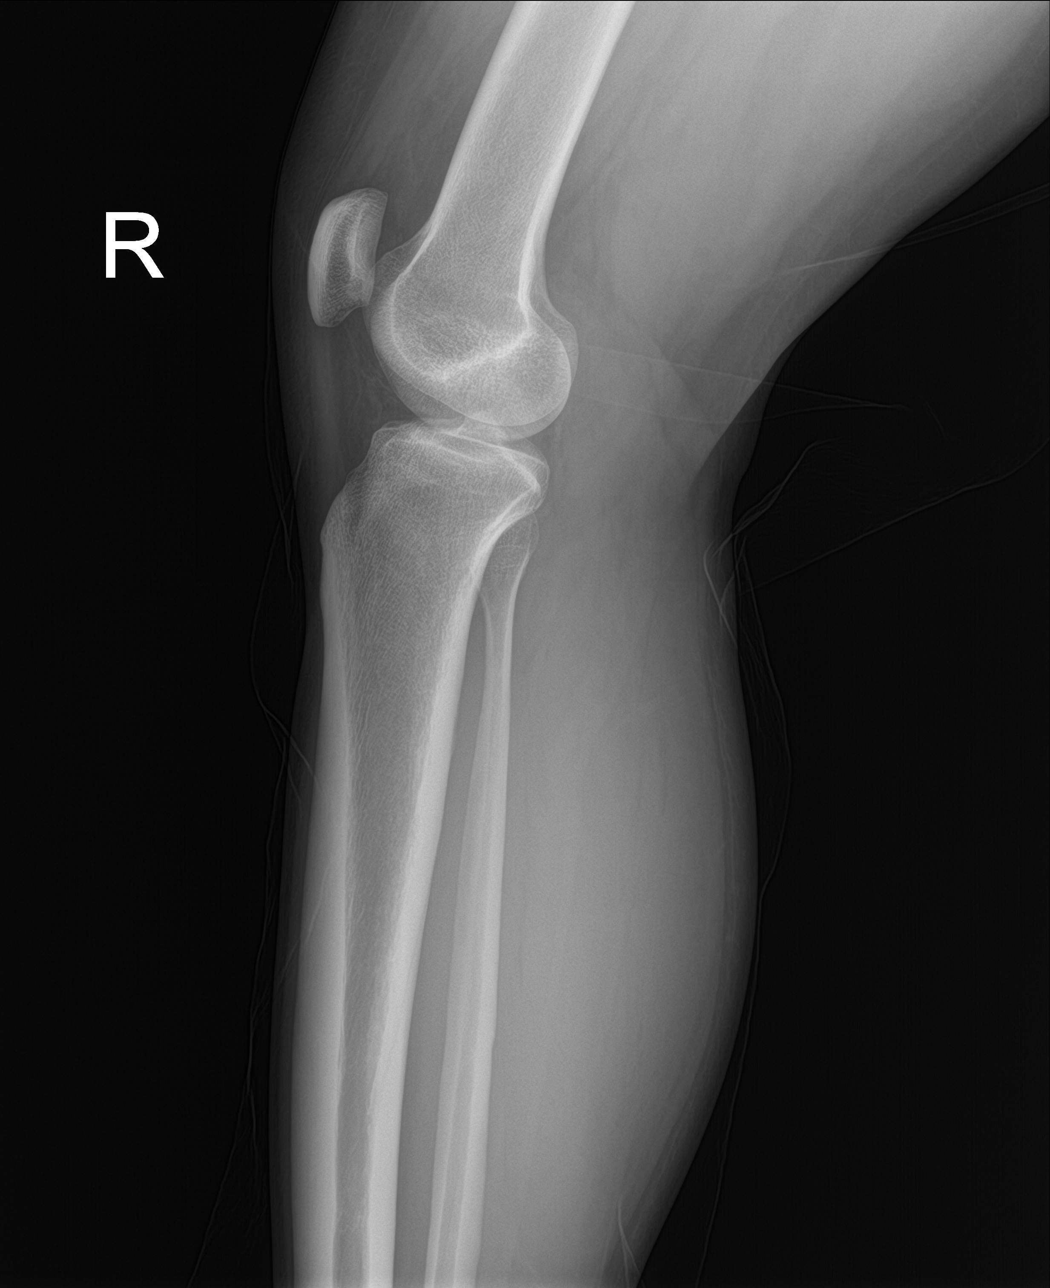

[4 of 4 positions shown; findings below may reference images not displayed]

FINDINGS: No evidence of fracture, dislocation, or joint effusion. No evidence
of arthropathy or other focal bone abnormality. Soft tissues are
unremarkable.
IMPRESSION: Negative.

## 2023-11-03 ENCOUNTER — Ambulatory Visit
Admission: RE | Admit: 2023-11-03 | Discharge: 2023-11-03 | Disposition: A | Discharge status: Home / Self Care | Payer: Self-pay | Source: Ambulatory Visit | Attending: Family Medicine | Admitting: Family Medicine

## 2023-11-03 VITALS — BP 130/71 | HR 95 | Temp 98.0°F | Resp 19

## 2023-11-03 DIAGNOSIS — R0602 Shortness of breath: Secondary | ICD-10-CM

## 2023-11-03 DIAGNOSIS — J4521 Mild intermittent asthma with (acute) exacerbation: Secondary | ICD-10-CM

## 2023-11-03 DIAGNOSIS — B349 Viral infection, unspecified: Secondary | ICD-10-CM

## 2023-11-03 MED ORDER — PROMETHAZINE-DM 6.25-15 MG/5ML PO SYRP: 5.0000 mL | ORAL_SOLUTION | Freq: Four times a day (QID) | ORAL | 0 refills | Status: AC | PRN

## 2023-11-03 MED ORDER — IPRATROPIUM-ALBUTEROL 0.5-2.5 (3) MG/3ML IN SOLN
3.0000 mL | Freq: Once | RESPIRATORY_TRACT | Status: AC
  Administered 2023-11-03: 3 mL via RESPIRATORY_TRACT

## 2023-11-03 MED ORDER — PREDNISONE 20 MG PO TABS: 40.0000 mg | ORAL_TABLET | Freq: Every day | ORAL | 0 refills | Status: AC

## 2023-11-03 MED ORDER — ALBUTEROL SULFATE HFA 108 (90 BASE) MCG/ACT IN AERS: 1.0000 | INHALATION_SPRAY | Freq: Four times a day (QID) | RESPIRATORY_TRACT | 0 refills | Status: AC | PRN

## 2023-11-03 NOTE — ED Notes (Signed)
 Pt presents with a cough, wheezing and SOB that started x 2 days. Pt used his albuterol  inhaler and cough syrup to help with symptoms.

## 2023-11-03 NOTE — ED Provider Notes (Signed)
 [facility]    CSN: [id number] Arrival date & time: 11/03/23  1114      History   Chief Complaint Chief Complaint  Patient presents with   Wheezing    Entered by patient    HPI Colton Fowler is a 33 y.o. male  presents for evaluation of URI symptoms for 1-2 days. Patient reports associated symptoms of intermittent cough with runny nose, shortness of breath and wheezing.  Had a sore throat earlier today but that is now resolved.  Denies N/V/D, fever, ear pain, body aches. Patient does have a hx of asthma.  He has run out of his albuterol  inhaler.  Patient is not an active smoker.   Reports no sick contacts.  Pt has taken cough syrup OTC for symptoms. Pt has no other concerns at this time.    Wheezing Associated symptoms: cough, rhinorrhea and shortness of breath     Past Medical History:  Diagnosis Date   Asthma    Bronchitis     There are no active problems to display for this patient.   History reviewed. No pertinent surgical history.     Home Medications    Prior to Admission medications   Medication Sig Start Date End Date Taking? Authorizing Provider  albuterol  (VENTOLIN  HFA) 108 (90 Base) MCG/ACT inhaler Inhale 1-2 puffs into the lungs every 6 (six) hours as needed. 11/03/23  Yes Zyir Gassert, Jodi R, NP  predniSONE  (DELTASONE ) 20 MG tablet Take 2 tablets (40 mg total) by mouth daily with breakfast for 5 days. 11/03/23 11/08/23 Yes Braya Habermehl, Jodi R, NP  promethazine -dextromethorphan (PROMETHAZINE -DM) 6.25-15 MG/5ML syrup Take 5 mLs by mouth 4 (four) times daily as needed for cough. 11/03/23  Yes Derinda Bartus, Jodi R, NP  cyclobenzaprine  (FLEXERIL ) 5 MG tablet Take 1 tablet (5 mg total) by mouth at bedtime as needed. 07/30/23   Adolph Hoop, PA-C  naproxen  (NAPROSYN ) 500 MG tablet Take 1 tablet (500 mg total) by mouth 2 (two) times daily with a meal. 07/30/23   Adolph Hoop, PA-C  fluticasone  (FLONASE ) 50 MCG/ACT nasal spray Place 1 spray into both nostrils daily. Patient not  taking: Reported on 06/21/2018 04/14/18 02/20/19  Bynum Cassis, NP    Family History No family history on file.  Social History Social History   Tobacco Use   Smoking status: Never   Smokeless tobacco: Never  Vaping Use   Vaping status: Never Used  Substance Use Topics   Alcohol use: Yes    Comment: occ   Drug use: Yes    Types: Marijuana     Allergies   Shellfish allergy   Review of Systems Review of Systems  HENT:  Positive for rhinorrhea.   Respiratory:  Positive for cough, shortness of breath and wheezing.      Physical Exam Triage Vital Signs ED Triage Vitals  Encounter Vitals Group     BP 11/03/23 1121 130/71     Systolic BP Percentile --      Diastolic BP Percentile --      Pulse Rate 11/03/23 1121 95     Resp 11/03/23 1121 19     Temp 11/03/23 1121 98 F (36.7 C)     Temp Source 11/03/23 1121 Oral     SpO2 11/03/23 1121 94 %     Weight --      Height --      Head Circumference --      Peak Flow --      Pain Score 11/03/23  1120 0     Pain Loc --      Pain Education --      Exclude from Growth Chart --    No data found.  Updated Vital Signs BP 130/71 (BP Location: Right Arm)   Pulse 95   Temp 98 F (36.7 C) (Oral)   Resp 19   SpO2 94%   Visual Acuity Right Eye Distance:   Left Eye Distance:   Bilateral Distance:    Right Eye Near:   Left Eye Near:    Bilateral Near:     Physical Exam Vitals and nursing note reviewed.  Constitutional:      General: He is not in acute distress.    Appearance: Normal appearance. He is not ill-appearing or toxic-appearing.  HENT:     Head: Normocephalic and atraumatic.     Right Ear: Tympanic membrane and ear canal normal.     Left Ear: Tympanic membrane and ear canal normal.     Nose: Rhinorrhea present. No congestion.     Mouth/Throat:     Mouth: Mucous membranes are moist.     Pharynx: No posterior oropharyngeal erythema.  Eyes:     Pupils: Pupils are equal, round, and reactive to light.   Cardiovascular:     Rate and Rhythm: Normal rate and regular rhythm.     Heart sounds: Normal heart sounds.  Pulmonary:     Effort: Pulmonary effort is normal.     Breath sounds: Normal breath sounds. No wheezing, rhonchi or rales.  Musculoskeletal:     Cervical back: Normal range of motion and neck supple.  Lymphadenopathy:     Cervical: No cervical adenopathy.  Skin:    General: Skin is warm and dry.  Neurological:     General: No focal deficit present.     Mental Status: He is alert and oriented to person, place, and time.  Psychiatric:        Mood and Affect: Mood normal.        Behavior: Behavior normal.      UC Treatments / Results  Labs (all labs ordered are listed, but only abnormal results are displayed) Labs Reviewed - No data to display  EKG   Radiology No results found.  Procedures Procedures (including critical care time)  Medications Ordered in UC Medications  ipratropium-albuterol  (DUONEB) 0.5-2.5 (3) MG/3ML nebulizer solution 3 mL (3 mLs Nebulization Given 11/03/23 1142)    Initial Impression / Assessment and Plan / UC Course  I have reviewed the triage vital signs and the nursing notes.  Pertinent labs & imaging results that were available during my care of the patient were reviewed by me and considered in my medical decision making (see chart for details).  Clinical Course as of 11/03/23 1207  Sun Nov 03, 2023  1207 O2 recheck after nebulizer 95% on room air [JM]    Clinical Course User Index [JM] Alleen Arbour, NP    Reviewed exam and symptoms with patient.  No red flags.  Patient declined COVID or flu testing.  Patient given DuoNeb in clinic for shortness of breath with improvement in symptoms.  Discussed viral illness with asthma exacerbation.  I did refill his albuterol  inhaler to use as needed.  Prednisone  daily for 5 days.  Promethazine  DM as needed for cough, side effect profile reviewed.  Discussed rest fluids and PCP follow-up if  symptoms do not improve.  ER precautions reviewed. Final Clinical Impressions(s) / UC Diagnoses   Final diagnoses:  Shortness of breath  Mild intermittent asthma with acute exacerbation  Viral illness     Discharge Instructions      I have refilled your albuterol  inhaler to use as needed for wheezing or shortness of breath.  Start prednisone  daily for 5 days.  May take Promethazine  DM as needed for your cough.  Please note this medication will make you drowsy.  Do not drink alcohol or drive on this medication.  Lots of rest and fluids.  Please follow-up with your PCP if your symptoms do not improve.  Please go to the ER if you develop any worsening symptoms.  Hope you feel better soon!   ED Prescriptions     Medication Sig Dispense Auth. Provider   albuterol  (VENTOLIN  HFA) 108 (90 Base) MCG/ACT inhaler Inhale 1-2 puffs into the lungs every 6 (six) hours as needed. 1 each Grayton Lobo, Jodi R, NP   predniSONE  (DELTASONE ) 20 MG tablet Take 2 tablets (40 mg total) by mouth daily with breakfast for 5 days. 10 tablet Wana Mount, Jodi R, NP   promethazine -dextromethorphan (PROMETHAZINE -DM) 6.25-15 MG/5ML syrup Take 5 mLs by mouth 4 (four) times daily as needed for cough. 118 mL Kaliah Haddaway, Jodi R, NP      PDMP not reviewed this encounter.   Alleen Arbour, NP 11/03/23 [phone]

## 2023-11-03 NOTE — Discharge Instructions (Signed)
 I have refilled your albuterol  inhaler to use as needed for wheezing or shortness of breath.  Start prednisone  daily for 5 days.  May take Promethazine  DM as needed for your cough.  Please note this medication will make you drowsy.  Do not drink alcohol or drive on this medication.  Lots of rest and fluids.  Please follow-up with your PCP if your symptoms do not improve.  Please go to the ER if you develop any worsening symptoms.  Hope you feel better soon!

## 2023-11-04 ENCOUNTER — Telehealth: Payer: Self-pay

## 2023-11-05 ENCOUNTER — Ambulatory Visit
Admission: EM | Admit: 2023-11-05 | Discharge: 2023-11-05 | Disposition: A | Discharge status: Home / Self Care | Attending: Family Medicine | Admitting: Family Medicine

## 2023-11-05 ENCOUNTER — Ambulatory Visit

## 2023-11-05 DIAGNOSIS — B9789 Other viral agents as the cause of diseases classified elsewhere: Secondary | ICD-10-CM | POA: Diagnosis not present

## 2023-11-05 DIAGNOSIS — J4531 Mild persistent asthma with (acute) exacerbation: Secondary | ICD-10-CM

## 2023-11-05 DIAGNOSIS — J988 Other specified respiratory disorders: Secondary | ICD-10-CM | POA: Diagnosis not present

## 2023-11-05 LAB — POC SARS CORONAVIRUS 2 AG -  ED: SARS Coronavirus 2 Ag: NEGATIVE

## 2023-11-05 MED ORDER — LEVOCETIRIZINE DIHYDROCHLORIDE 2.5 MG/5ML PO SOLN: 5.0000 mg | Freq: Every evening | ORAL | 5 refills | Status: AC

## 2023-11-05 NOTE — ED Triage Notes (Signed)
 Sore throat, cough up green phlegm and nasal congestion x 4 days. NO Fever.

## 2023-11-05 NOTE — ED Provider Notes (Signed)
 [name]  Note:  This document was prepared using Conservation officer, historic buildings and may include unintentional dictation errors.  MRN: [id number] DOB: 1991-04-09  Subjective:   Colton Fowler is a 33 y.o. male presenting for recheck on 4 to 5-day history of persistent sinus congestion, sinus drainage, throat discomfort, pressure of cough.  Patient's primary care turned his that he may have COVID and would like checked for this.  He has been using the prednisone  course which he thinks is helping but just wants to make sure he does not have COVID.  He is using the albuterol  as needed.  No facial pain, sinus pain, ear pain, chest pain, body pains.  No rashes.  No current facility-administered medications for this encounter.  Current Outpatient Medications:    albuterol  (VENTOLIN  HFA) 108 (90 Base) MCG/ACT inhaler, Inhale 1-2 puffs into the lungs every 6 (six) hours as needed., Disp: 1 each, Rfl: 0   cyclobenzaprine  (FLEXERIL ) 5 MG tablet, Take 1 tablet (5 mg total) by mouth at bedtime as needed., Disp: 30 tablet, Rfl: 0   naproxen  (NAPROSYN ) 500 MG tablet, Take 1 tablet (500 mg total) by mouth 2 (two) times daily with a meal., Disp: 30 tablet, Rfl: 0   predniSONE  (DELTASONE ) 20 MG tablet, Take 2 tablets (40 mg total) by mouth daily with breakfast for 5 days., Disp: 10 tablet, Rfl: 0   promethazine -dextromethorphan (PROMETHAZINE -DM) 6.25-15 MG/5ML syrup, Take 5 mLs by mouth 4 (four) times daily as needed for cough., Disp: 118 mL, Rfl: 0   Allergies  Allergen Reactions   Shellfish Allergy Swelling    Throat swelling    Past Medical History:  Diagnosis Date   Asthma    Bronchitis      History reviewed. No pertinent surgical history.  History reviewed. No pertinent family history.  Social History   Tobacco Use   Smoking status: Never   Smokeless tobacco: Never  Vaping Use   Vaping status: Never Used  Substance Use Topics   Alcohol use: Yes     Comment: occ   Drug use: Yes    Types: Marijuana    ROS   Objective:   Vitals: BP 134/80 (BP Location: Left Arm)   Pulse 64   Temp 98.2 F (36.8 C) (Oral)   Resp 18   SpO2 94%   Physical Exam Constitutional:      General: He is not in acute distress.    Appearance: Normal appearance. He is well-developed and normal weight. He is not ill-appearing, toxic-appearing or diaphoretic.  HENT:     Head: Normocephalic and atraumatic.     Right Ear: Tympanic membrane, ear canal and external ear normal. No drainage, swelling or tenderness. No middle ear effusion. There is no impacted cerumen. Tympanic membrane is not erythematous or bulging.     Left Ear: Tympanic membrane, ear canal and external ear normal. No drainage, swelling or tenderness.  No middle ear effusion. There is no impacted cerumen. Tympanic membrane is not erythematous or bulging.     Nose: Congestion present. No rhinorrhea.     Mouth/Throat:     Mouth: Mucous membranes are moist.     Pharynx: No oropharyngeal exudate or posterior oropharyngeal erythema.     Comments: Thick streaks of postnasal drainage overlying pharynx. Eyes:     General: No scleral icterus.       Right eye: No discharge.        Left eye: No discharge.  Extraocular Movements: Extraocular movements intact.     Conjunctiva/sclera: Conjunctivae normal.  Cardiovascular:     Rate and Rhythm: Normal rate and regular rhythm.     Heart sounds: Normal heart sounds. No murmur heard.    No friction rub. No gallop.  Pulmonary:     Effort: Pulmonary effort is normal. No respiratory distress.     Breath sounds: Normal breath sounds. No stridor. No wheezing, rhonchi or rales.  Musculoskeletal:     Cervical back: Normal range of motion and neck supple. No rigidity. No muscular tenderness.  Neurological:     General: No focal deficit present.     Mental Status: He is alert and oriented to person, place, and time.  Psychiatric:        Mood and Affect: Mood  normal.        Behavior: Behavior normal.        Thought Content: Thought content normal.     Results for orders placed or performed during the hospital encounter of 11/05/23 (from the past 24 hours)  POC SARS Coronavirus 2 Ag-ED - Nasal Swab     Status: None   Collection Time: 11/05/23  2:01 PM  Result Value Ref Range   SARS Coronavirus 2 Ag Negative Negative     Assessment and Plan :   PDMP not reviewed this encounter.  1. Viral respiratory infection   2. Mild persistent asthma with (acute) exacerbation    Deferred imaging given clear cardiopulmonary exam, hemodynamically stable vital signs.  Will uphold antibiotic stewardship.  Recommended he finish out prednisone , continue with supportive care and schedule albuterol .  Counseled patient on potential for adverse effects with medications prescribed/recommended today, ER and return-to-clinic precautions discussed, patient verbalized understanding.    Adolph Hoop, PA-C 11/05/23 1435

## 2023-11-05 NOTE — Discharge Instructions (Addendum)
 Please continue take the prednisone  course, use albuterol  as needed for your shortness of breath and respiratory symptoms.  I have prescribed levocetirizine for you to take daily to help with your sinuses and drainage.

## 2024-01-13 ENCOUNTER — Ambulatory Visit: Payer: Self-pay
